# Patient Record
Sex: Male | Born: 1970 | Race: White | Hispanic: No | Marital: Single
Health system: Midwestern US, Academic
[De-identification: ages and names within clinical notes are randomized; demographics above are authoritative.]

---

## 2019-10-21 ENCOUNTER — Encounter: Admit: 2019-10-21 | Discharge: 2019-10-21 | Payer: BC Managed Care – PPO

## 2019-10-21 NOTE — Telephone Encounter
He presented to [facility] with leg pain.  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  Apparently his symptoms have worsened recently, but he has had some leg pain for a while.  He is able to walk approximately 50 feet prior to onset of the pain.  He has no pain at rest or nonhealing ulcers by report.  He had vein mapping, but I have not been able to review this.  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  I reviewed the angiogram.  He will likely need a right distal SFA to popliteal bypass.  He has good tibial runoff.  There is severe tortuosity of the aneurysmal proximal popliteal artery proximal to the occlusion.  they were unable to cross the area of occlusion.    We will plan to set up outpatient office visit or telehealth visit to discuss prior to surgery.    Valrie Hart, MD

## 2019-10-22 ENCOUNTER — Encounter: Admit: 2019-10-22 | Discharge: 2019-10-22 | Payer: BC Managed Care – PPO

## 2019-11-02 ENCOUNTER — Encounter: Admit: 2019-11-02 | Discharge: 2019-11-02 | Payer: BC Managed Care – PPO

## 2019-11-04 ENCOUNTER — Ambulatory Visit: Admit: 2019-11-04 | Discharge: 2019-11-05 | Payer: BC Managed Care – PPO

## 2019-11-04 ENCOUNTER — Encounter: Admit: 2019-11-04 | Discharge: 2019-11-04 | Payer: BC Managed Care – PPO

## 2019-11-04 ENCOUNTER — Inpatient Hospital Stay: Admit: 2019-11-04 | Discharge: 2019-11-04 | Payer: BC Managed Care – PPO

## 2019-11-04 DIAGNOSIS — I739 Peripheral vascular disease, unspecified: Secondary | ICD-10-CM

## 2019-11-04 DIAGNOSIS — Z20828 Contact with and (suspected) exposure to other viral communicable diseases: Secondary | ICD-10-CM

## 2019-11-04 DIAGNOSIS — I10 Essential (primary) hypertension: Secondary | ICD-10-CM

## 2019-11-04 DIAGNOSIS — I82409 Acute embolism and thrombosis of unspecified deep veins of unspecified lower extremity: Secondary | ICD-10-CM

## 2019-11-04 DIAGNOSIS — I724 Aneurysm of artery of lower extremity: Secondary | ICD-10-CM

## 2019-11-04 DIAGNOSIS — L97512 Non-pressure chronic ulcer of other part of right foot with fat layer exposed: Secondary | ICD-10-CM

## 2019-11-04 DIAGNOSIS — I70201 Unspecified atherosclerosis of native arteries of extremities, right leg: Secondary | ICD-10-CM

## 2019-11-04 DIAGNOSIS — E782 Mixed hyperlipidemia: Secondary | ICD-10-CM

## 2019-11-04 MED ORDER — CEFAZOLIN IVPB
3 g | Freq: Once | INTRAVENOUS | 0 refills | Status: CN
Start: 2019-11-04 — End: ?

## 2019-11-04 NOTE — Patient Instructions
Check in at [facility] of [facility] main campus on November 12, 2019.     [address]  [place], [place] [address]    You can park in the P3 parking garage, located directly across from the main entrance of the hospital ([address] [place], [place] [address]), go through the main entrance, and on the left hand side next to the gift shop is admissions; this is where you will check-in on the day of surgery. You can validate your ticket at the admissions desk. You will receive a call from the surgical team the day prior to your surgery after 2 pm, to inform  you of what time you will need to arrive in admitting the day of your surgery.  If you have not been contacted by 4:30 pm the day prior to your surgery date, please call [phone] or [phone].    Follow the Hibiclens bathing protocol as instructed the night before and the morning of surgery.    You will be nothing by mouth (NPO) after midnight, the day before your surgery.  Take your morning blood pressure and heart pills as prescribed with a sip of water. Do NOT stop aspirin or Plavix(Clopidogrel) if applicable.  IF you take insulin: Hold all regular insulins that morning and if you take lantus at bedtime normally, take 1/3rd of the dose instead of the full dose night before surgery. Hold oral blood sugar lowering medications as well in the morning.     **Hold Xarelto 2 days prior to surgery, last dose on 11/10      For any questions please don't hesitate to call Joni Reining RN at [phone] or [phone]    Lower Extremity Bypass  What to expect when I have my Surgical Procedure...    Lower extremity bypass grafting includes procedures such as Femoral (groin) to Popliteal (behind the knee), Femoral-Tibial (calf) or Femoral-Distal (closer to the foot) Bypass Grafting.  The operation usually takes between 2 and 3 hours if a vein from your own body is used for the graft.  Using a synthetic graft takes less time but is only used when a vein is not available or when vein is not the best choice for the bypass.  Usually you will be discharged from the hospital in 3 days with synthetic graft, or 5 days with vein graft.  Pain should be adequately controlled in the hospital with IV, epidural, and oral medication. You will be given a prescription for pain pills when you leave the hospital.  It is not uncommon to have some redness and swelling in the area where you had your surgery.  The redness should gradually fade, and the swelling should be kept to a minimum by resting in between activities with the affected leg(s) elevated above the level of your heart (in a recliner, or on a couch or bed).  You will have no real lifting restrictions, although you should not carry anything too heavy while your wounds are healing.  You should walk regularly and gradually increase activity, but know that until you are completely healed, increased activity will increase swelling.  The more you can elevate the affected leg(s) between activities, the less swelling you will have.   Elevation will make it less likely that you will suffer from possible wound complications such as seroma (the collection of lymph fluid that can cause good sized lumps under the skin near incision lines), fluid drainage, and infection.  Smoking also increases your risk of having  complications, as does having Diabetes.  Post-operative complications increase your chances for having graft thrombosis which is a partial or complete loss of blood flow through the graft.  The most common post-op complication is leg swelling with fluid drainage which can be straw colored like urine, pink, or even red-brown like old blood. Other potential complications include bleeding, infection, and graft thrombosis.      You should call the Doctor immediately:    ? if your surgical site becomes so swollen that it is difficult to walk, or   ? if the swelling increases suddenly (over a few minutes) ? if you have any symptoms of loss of circulation to your leg(s) such as loss of a graft pulse, cold or discolored foot, and increased pain.  1.   2. You should also notify the Doctor if you experience any signs or symptoms of infection like:   3.   ? redness that gets worse rather than better,   ? drainage from the wound that is like pus, and   ? fever greater than 100.    You should arrange a follow up appointment with the Doctor 10-14 days following surgery for staple/suture removal.  You may need to call the office to arrange an appointment time. [phone]) Once the incision line has healed completely, using a vitamin E ointment or a moisturizing cream may help reduce scarring.      You may notice some numbness along your incision line, or even some strange sensations with walking after surgery.  This is not uncommon and often will resolve within a few weeks after surgery.  Sometimes the numbness can last for months, and in some cases, may not go away.  This is due to the unavoidable disruption of the nerves along the incision lines.      Your activity restrictions are minimal.  If you drive, you may resume driving one week following surgery, if you are no longer taking pain pills and can comfortably move your foot to and from the gas pedal and brake.  After one week, you may gently begin to return to your normal range of motion.    You may shower 72 hours after surgery, and pat the incision lines dry.     You should expect to take one enteric-coated aspirin (EC aspirin) every day from now on unless specifically instructed to do otherwise. Taking one aspirin a day (or the prescribed alternative) will help the graft(s) stay open and reduce the chances that you will need additional vascular surgery in the future. Sometimes Coumadin or anti-platelet medications like Ticlid or Plavix are required, especially when a synthetic graft is used. Maintaining blood flow through your new graft(s) will be an on-going process.  You can help maintain your graft in several ways.  First, Don?t Smoke!  Smoking increases the risk of graft failure, both in the short and long term.  Next, take your medications as directed.  Anti-platelet agents or blood thinners help maintain graft patency.  If you are diabetic, maintaining good blood sugar control will reduce the progression of new arterial plaque (plaque contributes to graft thrombosis).  If your cholesterol or triglyceride levels are elevated, these should be corrected with dietary changes and, when necessary, medication.      If you have a graft pulse that can be felt with your fingers, you will need to check it daily.  A loss of the graft pulse should be reported to your Doctor immediately.    Additional graft  monitoring will be provided on a regular basis through the office with either Doppler (sound waves) or Duplex (sound waves with imaging) of the graft.  Initially this will be every 3 months.  That time interval will increase the longer your graft remains open.    Your help in following these recommendations will assist [place] in providing you with the best possible care for you and your new Bypass Graft.    Bathing Instructions Before Surgery  Before surgery, you can play an important role in your health. Skin is not sterile, but you can reduce the number of germs on your skin by carefully washing before surgery. Please follow these instructions:  IMPORTANT:  You will need to shower with a special soap called chlorhexidine gluconate (CHG). A common brand name for this soap is Hibiclens, but any brand is acceptable. The soap comes in a liquid form and can be purchased from most pharmacies, or you may request it from the hospital at which you are having surgery. You will only need a small bottle, about 4-6 ounces.  Showering Instructions:  ? You will take two CHG showers. The first shower should be taken the night before surgery and the second shower the morning of surgery.  ? With each shower, wash your hair and face as usual with your normal shampoo and soap. Rinse your hair and body thoroughly afterward to remove the residue.  ? Do not shave the area of your body where your surgery will be performed. Any new cut, abrasion or rash on your surgical extremity will need to be evaluated and may cause a delay in your procedure.  ? Turn water off before applying the CHG soap to prevent rinsing it off too soon. Apply the soap to your entire body from the jaw down, using a clean (freshly laundered) washcloth or your hands. Do not use CHG near your eyes, ears, nose or mouth. Scrub thoroughly for 5 minutes, paying special attention to the area where your surgery will be performed. Do not scrub your skin too hard. Do not wash with your regular soap after using the CHG. Turn the water back on and rinse your body well.  ? Pat yourself dry with a fresh, clean (freshly laundered) towel after each shower. Put on clean clothes or pajamas. Use freshly laundered bed linens.  ? Do not apply any lotions, perfumes, or powders after use.

## 2019-11-04 NOTE — Progress Notes
PAC appt scheduled w/pt for 11/05/19 @ [address]. Pt's surgery on 11/12/19 w/Dr. Delice Bison. Pt hx includes DVT, PAD, CAD, CABG, HTN, HLD, Morbidly obese and current smoker. Labs from 10/20/19, glucose 165. Pt on Xarelto.

## 2019-11-05 ENCOUNTER — Encounter: Admit: 2019-11-05 | Discharge: 2019-11-05 | Payer: BC Managed Care – PPO

## 2019-11-07 ENCOUNTER — Encounter: Admit: 2019-11-07 | Discharge: 2019-11-07 | Payer: BC Managed Care – PPO

## 2019-11-07 DIAGNOSIS — I739 Peripheral vascular disease, unspecified: Secondary | ICD-10-CM

## 2019-11-07 DIAGNOSIS — I10 Essential (primary) hypertension: Secondary | ICD-10-CM

## 2019-11-07 DIAGNOSIS — I82409 Acute embolism and thrombosis of unspecified deep veins of unspecified lower extremity: Secondary | ICD-10-CM

## 2019-11-09 ENCOUNTER — Ambulatory Visit: Admit: 2019-11-09 | Discharge: 2019-11-09 | Payer: BC Managed Care – PPO

## 2019-11-09 ENCOUNTER — Encounter: Admit: 2019-11-09 | Discharge: 2019-11-09 | Payer: BC Managed Care – PPO

## 2019-11-09 DIAGNOSIS — Z01818 Encounter for other preprocedural examination: Secondary | ICD-10-CM

## 2019-11-09 DIAGNOSIS — E785 Hyperlipidemia, unspecified: Secondary | ICD-10-CM

## 2019-11-09 DIAGNOSIS — I739 Peripheral vascular disease, unspecified: Secondary | ICD-10-CM

## 2019-11-09 DIAGNOSIS — I2510 Atherosclerotic heart disease of native coronary artery without angina pectoris: Secondary | ICD-10-CM

## 2019-11-09 DIAGNOSIS — I82409 Acute embolism and thrombosis of unspecified deep veins of unspecified lower extremity: Secondary | ICD-10-CM

## 2019-11-09 DIAGNOSIS — I10 Essential (primary) hypertension: Secondary | ICD-10-CM

## 2019-11-09 DIAGNOSIS — G4733 Obstructive sleep apnea (adult) (pediatric): Secondary | ICD-10-CM

## 2019-11-09 NOTE — Telephone Encounter
COVID swab order faxed to [facility], they will contact the patient to schedule.

## 2019-11-09 NOTE — Progress Notes
[facility] Pharmacist Medication Plan Note:    Nicholas Rodgers was seen in the [facility] on 11/09/19.  As part of the visit, an accurate medication list was obtained and the patient was given pre-op medication instructions for upcoming surgery on 11/12/19 with Dr. Delice Bison.      Per Dolores Frame RN on 11/04/19, "Hold Xarelto 2 days prior to surgery, last dose on 11/10."    Per Dorena Cookey med assist with Dr. Laurier Nancy, okay to hold Xarelto for 2 days prior to the procedure.    The plan above was communicated to the patient who verbalized understanding during [facility] office visit.     David Stall, [place]

## 2019-11-10 ENCOUNTER — Encounter: Admit: 2019-11-10 | Discharge: 2019-11-10 | Payer: BC Managed Care – PPO

## 2019-11-10 DIAGNOSIS — Z20828 Contact with and (suspected) exposure to other viral communicable diseases: Secondary | ICD-10-CM

## 2019-11-10 LAB — COVID-19 (SARS-COV-2) PCR: Lab: NEGATIVE

## 2019-11-12 ENCOUNTER — Encounter: Admit: 2019-11-12 | Discharge: 2019-11-12 | Payer: BC Managed Care – PPO

## 2019-11-12 ENCOUNTER — Inpatient Hospital Stay: Admit: 2019-11-12 | Discharge: 2019-11-12 | Payer: BC Managed Care – PPO

## 2019-11-12 MED ORDER — ACETAMINOPHEN 325 MG PO TAB
650 mg | ORAL | 0 refills | Status: DC
Start: 2019-11-12 — End: 2019-11-16
  Administered 2019-11-12 – 2019-11-16 (×15): 650 mg via ORAL

## 2019-11-12 MED ORDER — MAGNESIUM SULFATE IN D5W 1 GRAM/100 ML IV PGBK
1 g | INTRAVENOUS | 0 refills | Status: DC | PRN
Start: 2019-11-12 — End: 2019-11-16

## 2019-11-12 MED ORDER — SODIUM CHLORIDE 0.9 % IV SOLP
1000 mL | INTRAVENOUS | 0 refills | Status: AC
Start: 2019-11-12 — End: ?
  Administered 2019-11-12: 14:00:00 1000 mL via INTRAVENOUS

## 2019-11-12 MED ORDER — RIVAROXABAN 20 MG PO TAB
20 mg | Freq: Every day | ORAL | 0 refills | Status: DC
Start: 2019-11-12 — End: 2019-11-16
  Administered 2019-11-13 – 2019-11-16 (×4): 20 mg via ORAL

## 2019-11-12 MED ORDER — ELECTROLYTE-A IV SOLP
0 refills | Status: DC
Start: 2019-11-12 — End: 2019-11-12
  Administered 2019-11-12: 16:00:00 via INTRAVENOUS

## 2019-11-12 MED ORDER — ONDANSETRON HCL (PF) 4 MG/2 ML IJ SOLN
4 mg | INTRAVENOUS | 0 refills | Status: DC | PRN
Start: 2019-11-12 — End: 2019-11-13

## 2019-11-12 MED ORDER — OXYCODONE 5 MG PO TAB
5-10 mg | Freq: Once | ORAL | 0 refills | Status: CP | PRN
Start: 2019-11-12 — End: ?
  Administered 2019-11-12: 20:00:00 10 mg via ORAL

## 2019-11-12 MED ORDER — ONDANSETRON HCL (PF) 4 MG/2 ML IJ SOLN
INTRAVENOUS | 0 refills | Status: DC
Start: 2019-11-12 — End: 2019-11-12
  Administered 2019-11-12: 19:00:00 4 mg via INTRAVENOUS

## 2019-11-12 MED ORDER — POTASSIUM, SODIUM PHOSPHATES 280-160-250 MG PO PWPK
1 | NASOGASTRIC | 0 refills | Status: DC | PRN
Start: 2019-11-12 — End: 2019-11-16

## 2019-11-12 MED ORDER — LIDOCAINE (PF) 200 MG/10 ML (2 %) IJ SYRG
0 refills | Status: DC
Start: 2019-11-12 — End: 2019-11-12
  Administered 2019-11-12: 16:00:00 100 mg via INTRAVENOUS

## 2019-11-12 MED ORDER — FENTANYL CITRATE (PF) 50 MCG/ML IJ SOLN
12.5-25 ug | INTRAVENOUS | 0 refills | Status: DC | PRN
Start: 2019-11-12 — End: 2019-11-12

## 2019-11-12 MED ORDER — ROCURONIUM 10 MG/ML IV SOLN
INTRAVENOUS | 0 refills | Status: DC
Start: 2019-11-12 — End: 2019-11-12
  Administered 2019-11-12: 18:00:00 20 mg via INTRAVENOUS
  Administered 2019-11-12: 16:00:00 50 mg via INTRAVENOUS
  Administered 2019-11-12 (×3): 20 mg via INTRAVENOUS

## 2019-11-12 MED ORDER — KETAMINE 10 MG/ML IJ SOLN
0 refills | Status: DC
Start: 2019-11-12 — End: 2019-11-12
  Administered 2019-11-12: 18:00:00 30 mg via INTRAVENOUS

## 2019-11-12 MED ORDER — PROPOFOL INJ 10 MG/ML IV VIAL
0 refills | Status: DC
Start: 2019-11-12 — End: 2019-11-12
  Administered 2019-11-12: 16:00:00 200 mg via INTRAVENOUS

## 2019-11-12 MED ORDER — MIDAZOLAM 1 MG/ML IJ SOLN
INTRAVENOUS | 0 refills | Status: DC
Start: 2019-11-12 — End: 2019-11-12
  Administered 2019-11-12: 16:00:00 2 mg via INTRAVENOUS

## 2019-11-12 MED ORDER — MORPHINE 2 MG/ML IV SYRG
2 mg | INTRAVENOUS | 0 refills | Status: DC | PRN
Start: 2019-11-12 — End: 2019-11-13

## 2019-11-12 MED ORDER — PROTAMINE 10 MG/ML IV SOLN
0 refills | Status: DC
Start: 2019-11-12 — End: 2019-11-12
  Administered 2019-11-12: 19:00:00 50 mg via INTRAVENOUS

## 2019-11-12 MED ORDER — POTASSIUM CHLORIDE 20 MEQ PO TBTQ
40-60 meq | ORAL | 0 refills | Status: DC | PRN
Start: 2019-11-12 — End: 2019-11-16

## 2019-11-12 MED ORDER — SENNOSIDES-DOCUSATE SODIUM 8.6-50 MG PO TAB
2 | Freq: Two times a day (BID) | ORAL | 0 refills | Status: DC | PRN
Start: 2019-11-12 — End: 2019-11-15

## 2019-11-12 MED ORDER — ONDANSETRON HCL (PF) 4 MG/2 ML IJ SOLN
4 mg | Freq: Once | INTRAVENOUS | 0 refills | Status: DC | PRN
Start: 2019-11-12 — End: 2019-11-12

## 2019-11-12 MED ORDER — OXYCODONE 5 MG PO TAB
5-10 mg | ORAL | 0 refills | Status: DC | PRN
Start: 2019-11-12 — End: 2019-11-14
  Administered 2019-11-12 – 2019-11-14 (×9): 10 mg via ORAL

## 2019-11-12 MED ORDER — HEPARIN 5,000 UNITS IN LR 500 ML IRR
0 refills | Status: DC
Start: 2019-11-12 — End: 2019-11-12
  Administered 2019-11-12 (×2): 500 mL

## 2019-11-12 MED ORDER — LISINOPRIL-HYDROCHLOROTHIAZIDE 20-12.5 MG TAB
Freq: Every day | ORAL | 0 refills | Status: DC
Start: 2019-11-12 — End: 2019-11-14
  Administered 2019-11-12 – 2019-11-14 (×6): via ORAL

## 2019-11-12 MED ORDER — CEFAZOLIN 1GM NS IRR
0 refills | Status: DC
Start: 2019-11-12 — End: 2019-11-12
  Administered 2019-11-12 (×2): 1000 mL

## 2019-11-12 MED ORDER — SODIUM PHOSPHATE IVPB
8 MMOL | INTRAVENOUS | 0 refills | Status: DC | PRN
Start: 2019-11-12 — End: 2019-11-16

## 2019-11-12 MED ORDER — LIDOCAINE (PF) 10 MG/ML (1 %) IJ SOLN
.1-2 mL | INTRAMUSCULAR | 0 refills | Status: DC | PRN
Start: 2019-11-12 — End: 2019-11-16

## 2019-11-12 MED ORDER — PHENYLEPHRINE 40 MCG/ML IN NS IV DRIP (STD CONC)
0 refills | Status: DC
Start: 2019-11-12 — End: 2019-11-12
  Administered 2019-11-12 (×2): 0.2 ug/kg/min via INTRAVENOUS

## 2019-11-12 MED ORDER — POTASSIUM CHLORIDE IN WATER 10 MEQ/50 ML IV PGBK
10 meq | INTRAVENOUS | 0 refills | Status: DC | PRN
Start: 2019-11-12 — End: 2019-11-16

## 2019-11-12 MED ORDER — FENTANYL CITRATE (PF) 50 MCG/ML IJ SOLN
25-50 ug | INTRAVENOUS | 0 refills | Status: DC | PRN
Start: 2019-11-12 — End: 2019-11-12
  Administered 2019-11-12: 20:00:00 50 ug via INTRAVENOUS

## 2019-11-12 MED ORDER — DEXTRAN 70-HYPROMELLOSE (PF) 0.1-0.3 % OP DPET
0 refills | Status: DC
Start: 2019-11-12 — End: 2019-11-12
  Administered 2019-11-12: 16:00:00 2 [drp] via OPHTHALMIC

## 2019-11-12 MED ORDER — SENNOSIDES-DOCUSATE SODIUM 8.6-50 MG PO TAB
2 | Freq: Every day | ORAL | 0 refills | Status: CN | PRN
Start: 2019-11-12 — End: ?

## 2019-11-12 MED ORDER — ATORVASTATIN 40 MG PO TAB
40 mg | Freq: Every day | ORAL | 0 refills | Status: DC
Start: 2019-11-12 — End: 2019-11-16
  Administered 2019-11-13 – 2019-11-16 (×4): 40 mg via ORAL

## 2019-11-12 MED ORDER — HEPARIN (PORCINE) 1,000 UNIT/ML IJ SOLN
0 refills | Status: DC
Start: 2019-11-12 — End: 2019-11-12
  Administered 2019-11-12: 18:00:00 15000 [IU] via INTRAVENOUS

## 2019-11-12 MED ORDER — CEFAZOLIN INJ 1GM IVP
3 g | Freq: Once | INTRAVENOUS | 0 refills | Status: CP
Start: 2019-11-12 — End: ?
  Administered 2019-11-12: 16:00:00 3 g via INTRAVENOUS

## 2019-11-12 MED ORDER — MAGNESIUM CHLORIDE 64 MG PO TBER
535 mg | ORAL | 0 refills | Status: DC | PRN
Start: 2019-11-12 — End: 2019-11-16
  Administered 2019-11-13 – 2019-11-14 (×2): 535 mg via ORAL

## 2019-11-12 MED ORDER — SUGAMMADEX 100 MG/ML IV SOLN
INTRAVENOUS | 0 refills | Status: DC
Start: 2019-11-12 — End: 2019-11-12
  Administered 2019-11-12: 19:00:00 357 mg via INTRAVENOUS

## 2019-11-12 MED ORDER — HEPARIN 5,000 UNITS IN LR 500 ML IRR
Freq: Once | 0 refills | Status: AC
Start: 2019-11-12 — End: ?

## 2019-11-12 MED ORDER — POTASSIUM CHLORIDE 20 MEQ/15 ML PO LIQD
40-60 meq | NASOGASTRIC | 0 refills | Status: DC | PRN
Start: 2019-11-12 — End: 2019-11-16

## 2019-11-12 MED ORDER — FENTANYL CITRATE (PF) 50 MCG/ML IJ SOLN
0 refills | Status: DC
Start: 2019-11-12 — End: 2019-11-12
  Administered 2019-11-12 (×2): 50 ug via INTRAVENOUS
  Administered 2019-11-12: 16:00:00 150 ug via INTRAVENOUS

## 2019-11-12 MED ORDER — PHENYLEPHRINE IN 0.9% NACL(PF) 1 MG/10 ML (100 MCG/ML) IV SYRG
INTRAVENOUS | 0 refills | Status: DC
Start: 2019-11-12 — End: 2019-11-12
  Administered 2019-11-12 (×3): 100 ug via INTRAVENOUS

## 2019-11-12 MED ORDER — LABETALOL 5 MG/ML IV SYRG
5 mg | 0 refills | Status: DC | PRN
Start: 2019-11-12 — End: 2019-11-16

## 2019-11-12 MED ORDER — PROMETHAZINE 25 MG/ML IJ SOLN
6.25 mg | INTRAVENOUS | 0 refills | Status: DC | PRN
Start: 2019-11-12 — End: 2019-11-12

## 2019-11-12 MED ORDER — POTASSIUM PHOSPHATE, MONOBASIC 500 MG PO TBSO
2 | ORAL | 0 refills | Status: DC | PRN
Start: 2019-11-12 — End: 2019-11-16

## 2019-11-12 MED ORDER — SUCCINYLCHOLINE CHLORIDE 20 MG/ML IJ SOLN
INTRAVENOUS | 0 refills | Status: DC
Start: 2019-11-12 — End: 2019-11-12
  Administered 2019-11-12: 16:00:00 50 mg via INTRAVENOUS
  Administered 2019-11-12: 16:00:00 200 mg via INTRAVENOUS

## 2019-11-12 MED ORDER — ASPIRIN 81 MG PO CHEW
81 mg | Freq: Every day | ORAL | 0 refills | Status: DC
Start: 2019-11-12 — End: 2019-11-16
  Administered 2019-11-13 – 2019-11-16 (×4): 81 mg via ORAL

## 2019-11-12 MED ORDER — HYDROMORPHONE (PF) 2 MG/ML IJ SYRG
0 refills | Status: DC
Start: 2019-11-12 — End: 2019-11-12
  Administered 2019-11-12: 17:00:00 .5 mg via INTRAVENOUS
  Administered 2019-11-12: 18:00:00 0.5 mg via INTRAVENOUS
  Administered 2019-11-12 (×2): .5 mg via INTRAVENOUS

## 2019-11-12 MED ORDER — DEXAMETHASONE SODIUM PHOSPHATE 4 MG/ML IJ SOLN
INTRAVENOUS | 0 refills | Status: DC
Start: 2019-11-12 — End: 2019-11-12
  Administered 2019-11-12: 16:00:00 4 mg via INTRAVENOUS

## 2019-11-12 MED ORDER — FENTANYL CITRATE (PF) 50 MCG/ML IJ SOLN
50-100 ug | INTRAVENOUS | 0 refills | Status: DC | PRN
Start: 2019-11-12 — End: 2019-11-12
  Administered 2019-11-12: 20:00:00 50 ug via INTRAVENOUS

## 2019-11-12 MED ORDER — LABETALOL 5 MG/ML IV SYRG
0 refills | Status: DC
Start: 2019-11-12 — End: 2019-11-12
  Administered 2019-11-12 (×2): 5 mg via INTRAVENOUS

## 2019-11-12 MED ORDER — HALOPERIDOL LACTATE 5 MG/ML IJ SOLN
1 mg | Freq: Once | INTRAVENOUS | 0 refills | Status: DC | PRN
Start: 2019-11-12 — End: 2019-11-12

## 2019-11-13 MED ORDER — MUPIROCIN 2 % TP OINT
Freq: Two times a day (BID) | TOPICAL | 0 refills | Status: DC
Start: 2019-11-13 — End: 2019-11-16
  Administered 2019-11-13: 16:00:00 via TOPICAL

## 2019-11-13 MED ORDER — ONDANSETRON HCL (PF) 4 MG/2 ML IJ SOLN
4-8 mg | INTRAVENOUS | 0 refills | Status: DC | PRN
Start: 2019-11-13 — End: 2019-11-16

## 2019-11-14 ENCOUNTER — Encounter: Admit: 2019-11-14 | Discharge: 2019-11-14 | Payer: BC Managed Care – PPO

## 2019-11-14 DIAGNOSIS — I2510 Atherosclerotic heart disease of native coronary artery without angina pectoris: Secondary | ICD-10-CM

## 2019-11-14 DIAGNOSIS — E785 Hyperlipidemia, unspecified: Secondary | ICD-10-CM

## 2019-11-14 DIAGNOSIS — I739 Peripheral vascular disease, unspecified: Secondary | ICD-10-CM

## 2019-11-14 DIAGNOSIS — I82409 Acute embolism and thrombosis of unspecified deep veins of unspecified lower extremity: Secondary | ICD-10-CM

## 2019-11-14 DIAGNOSIS — I10 Essential (primary) hypertension: Secondary | ICD-10-CM

## 2019-11-14 DIAGNOSIS — G4733 Obstructive sleep apnea (adult) (pediatric): Secondary | ICD-10-CM

## 2019-11-14 MED ORDER — OXYCODONE 5 MG PO TAB
5-15 mg | ORAL | 0 refills | Status: DC | PRN
Start: 2019-11-14 — End: 2019-11-16
  Administered 2019-11-14 (×5): 5 mg via ORAL
  Administered 2019-11-14: 22:00:00 10 mg via ORAL
  Administered 2019-11-15 – 2019-11-16 (×10): 15 mg via ORAL

## 2019-11-14 MED ORDER — LISINOPRIL 20 MG PO TAB
20 mg | Freq: Every day | ORAL | 0 refills | Status: DC
Start: 2019-11-14 — End: 2019-11-16
  Administered 2019-11-15 – 2019-11-16 (×2): 20 mg via ORAL

## 2019-11-15 ENCOUNTER — Encounter: Admit: 2019-11-15 | Discharge: 2019-11-15 | Payer: BC Managed Care – PPO

## 2019-11-15 DIAGNOSIS — I10 Essential (primary) hypertension: Secondary | ICD-10-CM

## 2019-11-15 DIAGNOSIS — I2510 Atherosclerotic heart disease of native coronary artery without angina pectoris: Secondary | ICD-10-CM

## 2019-11-15 DIAGNOSIS — I739 Peripheral vascular disease, unspecified: Secondary | ICD-10-CM

## 2019-11-15 DIAGNOSIS — I82409 Acute embolism and thrombosis of unspecified deep veins of unspecified lower extremity: Secondary | ICD-10-CM

## 2019-11-15 DIAGNOSIS — G4733 Obstructive sleep apnea (adult) (pediatric): Secondary | ICD-10-CM

## 2019-11-15 DIAGNOSIS — E785 Hyperlipidemia, unspecified: Secondary | ICD-10-CM

## 2019-11-15 MED ORDER — POLYETHYLENE GLYCOL 3350 17 GRAM PO PWPK
1 | Freq: Every day | ORAL | 0 refills | Status: DC
Start: 2019-11-15 — End: 2019-11-16

## 2019-11-15 MED ORDER — BISACODYL 10 MG RE SUPP
10 mg | Freq: Every day | RECTAL | 0 refills | Status: DC | PRN
Start: 2019-11-15 — End: 2019-11-16

## 2019-11-15 MED ORDER — SENNOSIDES-DOCUSATE SODIUM 8.6-50 MG PO TAB
2 | Freq: Two times a day (BID) | ORAL | 0 refills | Status: DC
Start: 2019-11-15 — End: 2019-11-16
  Administered 2019-11-16 (×2): 2 via ORAL

## 2019-11-16 ENCOUNTER — Encounter: Admit: 2019-11-16 | Discharge: 2019-11-16 | Payer: BC Managed Care – PPO

## 2019-11-16 MED ORDER — ACETAMINOPHEN 325 MG PO TAB
650 mg | ORAL | 0 refills | Status: AC | PRN
Start: 2019-11-16 — End: ?

## 2019-11-16 MED ORDER — POLYETHYLENE GLYCOL 3350 17 GRAM PO PWPK
17 g | Freq: Every day | ORAL | 0 refills | 18.00000 days | Status: DC | PRN
Start: 2019-11-16 — End: 2020-01-02

## 2019-11-16 MED ORDER — POLYETHYLENE GLYCOL 3350 17 GRAM PO PWPK
17 g | Freq: Every day | ORAL | 0 refills | Status: CN | PRN
Start: 2019-11-16 — End: ?

## 2019-11-16 MED ORDER — NALOXONE 4 MG/ACTUATION NA SPRY
4 mg | Freq: Once | NASAL | 1 refills | 1.00000 days | Status: AC
Start: 2019-11-16 — End: ?

## 2019-11-16 MED ORDER — OXYCODONE 5 MG PO TAB
5-10 mg | ORAL_TABLET | ORAL | 0 refills | 6.00000 days | Status: DC | PRN
Start: 2019-11-16 — End: 2019-11-23

## 2019-11-16 MED ORDER — MUPIROCIN 2 % TP OINT
Freq: Two times a day (BID) | TOPICAL | 3 refills | 11.00000 days | Status: DC
Start: 2019-11-16 — End: 2020-01-02

## 2019-11-16 MED ORDER — SENNOSIDES-DOCUSATE SODIUM 8.6-50 MG PO TAB
2 | Freq: Every day | ORAL | 0 refills | Status: DC | PRN
Start: 2019-11-16 — End: 2020-01-02

## 2019-11-17 ENCOUNTER — Encounter: Admit: 2019-11-17 | Discharge: 2019-11-17 | Payer: BC Managed Care – PPO

## 2019-11-17 NOTE — Telephone Encounter
I received a call from [facility] / Amy RN. Authorization for an extended stay at [place] was approved for a total of 4 days beginning 11/12/19. Auth# [id number]. Call back # [phone] with fax # [phone].

## 2019-11-18 ENCOUNTER — Encounter: Admit: 2019-11-18 | Discharge: 2019-11-18 | Payer: BC Managed Care – PPO

## 2019-11-18 NOTE — Telephone Encounter
Add:  Notified Risk manager of daily dressing changes, she states another dilemma is the patient only has 30 home health visits a year with his insurance and she would like this to be placed into consideration, notified her that patient has office eval on 12.10.20 for re-eval but will forward to [facility] as fyi for follow up eval.

## 2019-11-18 NOTE — Telephone Encounter
Bella Kennedy, from [facility] in [place], is calling about the pt's discharge wound care orders. She started seeing him yesterday and his orders have dressing changes 2x daily. He is unable to do that himself, he is unable to bend over and reach his foot, there's no one to help him, and the facility is unable to do the 2x daily. Wondering if orders can be changed to once daily or once every other day. Please return call [phone]

## 2019-11-18 NOTE — Telephone Encounter
Reviewed with [facility] RN, ok to decrease to once daily dressing changes, not every other day.

## 2019-11-22 ENCOUNTER — Encounter: Admit: 2019-11-22 | Discharge: 2019-11-22 | Payer: BC Managed Care – PPO

## 2019-11-22 NOTE — Telephone Encounter
Who is calling? Nicholas Rodgers    What is the patients need/concern? PT STATES THAT HE IS OUT OF HIS OXYCODONE.    Which hospital was the patient seen at? Other N/A    Are there any other needs/concerns the patient has?  no      If yes, description of concern:    Patient was notified that if call was received after 3:30 pm the call may not be returned until the next business day. YES    Read back to patient before sending: yes    Call Back phone number: [phone]

## 2019-11-23 MED ORDER — OXYCODONE 5 MG PO TAB
5-10 mg | ORAL_TABLET | ORAL | 0 refills | 6.00000 days | Status: DC | PRN
Start: 2019-11-23 — End: 2019-11-29

## 2019-11-29 MED ORDER — ATORVASTATIN 40 MG PO TAB
40 mg | ORAL_TABLET | Freq: Every day | ORAL | 3 refills | Status: AC
Start: 2019-11-29 — End: ?

## 2019-11-29 MED ORDER — OXYCODONE 5 MG PO TAB
5-10 mg | ORAL_TABLET | ORAL | 0 refills | 6.00000 days | Status: DC | PRN
Start: 2019-11-29 — End: 2019-12-06

## 2019-12-06 ENCOUNTER — Encounter: Admit: 2019-12-06 | Discharge: 2019-12-06 | Payer: BC Managed Care – PPO

## 2019-12-06 MED ORDER — OXYCODONE 5 MG PO TAB
5-10 mg | ORAL_TABLET | ORAL | 0 refills | 6.00000 days | Status: DC | PRN
Start: 2019-12-06 — End: 2020-01-11

## 2019-12-06 NOTE — Telephone Encounter
The patient called asking for a refill of his pain medication. Will further discuss with our nurse practitioner.

## 2019-12-06 NOTE — Telephone Encounter
I have refilled his prescription but he needs to be weaning off the narcotics.  I  refilled his prescription just a week ago too.Marland Kitchen  Neale Burly, APRN-NP

## 2019-12-09 ENCOUNTER — Encounter: Admit: 2019-12-09 | Discharge: 2019-12-09 | Payer: BC Managed Care – PPO

## 2019-12-09 ENCOUNTER — Ambulatory Visit: Admit: 2019-12-09 | Discharge: 2019-12-10 | Payer: BC Managed Care – PPO

## 2019-12-09 DIAGNOSIS — I82409 Acute embolism and thrombosis of unspecified deep veins of unspecified lower extremity: Secondary | ICD-10-CM

## 2019-12-09 DIAGNOSIS — E785 Hyperlipidemia, unspecified: Secondary | ICD-10-CM

## 2019-12-09 DIAGNOSIS — I10 Essential (primary) hypertension: Secondary | ICD-10-CM

## 2019-12-09 DIAGNOSIS — I739 Peripheral vascular disease, unspecified: Secondary | ICD-10-CM

## 2019-12-09 DIAGNOSIS — Z720 Tobacco use: Secondary | ICD-10-CM

## 2019-12-09 DIAGNOSIS — Z6841 Body Mass Index (BMI) 40.0 and over, adult: Secondary | ICD-10-CM

## 2019-12-09 DIAGNOSIS — Z95828 Presence of other vascular implants and grafts: Secondary | ICD-10-CM

## 2019-12-09 DIAGNOSIS — I2510 Atherosclerotic heart disease of native coronary artery without angina pectoris: Secondary | ICD-10-CM

## 2019-12-09 DIAGNOSIS — G4733 Obstructive sleep apnea (adult) (pediatric): Secondary | ICD-10-CM

## 2019-12-09 DIAGNOSIS — I70201 Unspecified atherosclerosis of native arteries of extremities, right leg: Secondary | ICD-10-CM

## 2019-12-09 MED ORDER — CEPHALEXIN 500 MG PO CAP
500 mg | ORAL_CAPSULE | Freq: Four times a day (QID) | ORAL | 0 refills | Status: AC
Start: 2019-12-09 — End: ?

## 2019-12-09 NOTE — Progress Notes
Date of Service: 12/09/2019              Chief Complaint   Patient presents with   ? Post Operative Visit     Right fem-pop bypass       History of Present Illness    This is a 48 year old gentleman who is status post Right superficial femoral artery to distal popliteal artery bypass with non-reversed great saphenous vein graft on November 13 of 2020 by Dr. Hollie Beach here at [facility].  He has a history of hypertension, hyperlipidemia, morbid obesity, deep vein thrombosis and coronary artery disease.  He denies current chest pain and shortness of breath.  He denies fever and chills.  He denies significant leg pain.  He says his right foot pain has resolved.  He complains of some small amount of serous drainage at the inferior aspect of his most superior incision on his thigh.  He would like to return to work in about a week.    He does admit to continued smoking.  He is on Xarelto and Lipitor daily.           Medical History:   Diagnosis Date   ? Coronary artery disease    ? Deep vein thrombosis (HCC)     bilateral lower extrmities   ? Hyperlipidemia    ? Hypertension    ? OSA on CPAP    ? Peripheral artery disease [facility])        Surgical History:   Procedure Laterality Date   ? BYPASS GRAFT FEMORAL-POPLITEAL ARTERY Right 11/12/2019    Performed by Lonell Grandchild, MD at [facility] OR   ? HX CORONARY ARTERY BYPASS GRAFT  ~2004    triple bypass       Allergies:  No Known Allergies    Medication List:  ? acetaminophen (TYLENOL) 325 mg tablet Take two tablets by mouth every 4 hours as needed for Pain. TOTAL ACETAMINOPHEN DOSE NOT TO EXCEED 4GM DAILY  Indications: pain   ? atorvastatin (LIPITOR) 40 mg tablet Take one tablet by mouth daily.   ? cephalexin (KEFLEX) 500 mg capsule Take one capsule by mouth four times daily for 7 days.   ? lisinopriL-hydrochlorothiazide (ZESTORETIC) 20-12.5 mg tablet Take 1 tablet by mouth daily. ? mupirocin (BACTROBAN) 2 % topical ointment Apply  topically to affected area twice daily. Apply to wounds on right plantar foot and right medial knee with twice daily dressing changes.   ? oxyCODONE (ROXICODONE) 5 mg tablet Take one tablet to two tablets by mouth every 4 hours as needed for Pain Indications: pain, s/p Right Femoral - Popliteal Artery Bypass Graft 11/12/2019   ? polyethylene glycol 3350 (MIRALAX) 17 g packet Take one packet by mouth daily as needed. Indications: constipation   ? senna/docusate (SENOKOT-S) 8.6/50 mg tablet Take two tablets by mouth daily as needed. Indications: constipation   ? vitamins, multiple tablet Take 1 tablet by mouth.   ? XARELTO 20 mg tablet Take 20 mg by mouth daily with breakfast.       Social History:   reports that he has been smoking cigarettes. He has a 17.50 pack-year smoking history. He has never used smokeless tobacco. He reports current alcohol use. He reports that he does not use drugs.    Family History   Problem Relation Age of Onset   ? Heart Disease Father    ? Cancer Father    ? Cancer Maternal Grandfather        Review of  Systems   Constitutional: Negative for activity change, appetite change, chills, diaphoresis, fatigue, fever and unexpected weight change.   HENT: Negative for hearing loss, nosebleeds and tinnitus.    Eyes: Negative for photophobia, itching and visual disturbance.   Respiratory: Positive for apnea. Negative for cough, chest tightness and shortness of breath.    Cardiovascular: Negative for chest pain, palpitations and leg swelling.   Gastrointestinal: Negative for abdominal pain, blood in stool, constipation, diarrhea, nausea and vomiting.   Endocrine: Negative for cold intolerance and heat intolerance.   Genitourinary: Negative for dysuria, frequency and hematuria.   Musculoskeletal: Negative for arthralgias, back pain, gait problem, joint swelling and myalgias.   Skin: Positive for wound. Negative for color change and rash. Allergic/Immunologic: Negative for environmental allergies, food allergies and immunocompromised state.   Neurological: Negative for dizziness, tremors, seizures, syncope, facial asymmetry, speech difficulty, weakness, light-headedness and headaches.   Hematological: Negative for adenopathy. Does not bruise/bleed easily.   Psychiatric/Behavioral: Negative for behavioral problems, confusion and dysphoric mood. The patient is not nervous/anxious.    All other systems reviewed and are negative.              Vitals:    12/09/19 0935 12/09/19 0937   BP: 103/61 124/63   BP Source: Arm, Right Upper Arm, Left Upper   Patient Position: Sitting Sitting   Pulse: 96 94   Temp:  36.8 ?C (98.2 ?F)   TempSrc:  Oral   Weight:  (!) 179.6 kg (396 lb)   Height:  182.9 cm (72)     Body mass index is 53.71 kg/m?Marland Kitchen     Physical Exam  Vitals signs and nursing note reviewed.   Constitutional:       General: He is not in acute distress.     Appearance: He is well-developed and well-groomed. He is morbidly obese. He is not diaphoretic.   HENT:      Head: Normocephalic.   Eyes:      General:         Right eye: No discharge.         Left eye: No discharge.   Neck:      Musculoskeletal: Normal range of motion.      Trachea: Phonation normal.   Cardiovascular:      Pulses:           Femoral pulses are 2+ on the right side and 2+ on the left side.     Comments: He has an easily palpable right medial leg bypass graft pulse.  Pulmonary:      Effort: Pulmonary effort is normal. No respiratory distress.   Feet:      Right foot:      Skin integrity: Skin integrity normal.      Left foot:      Skin integrity: Skin integrity normal.   Skin:     General: Skin is warm and dry.      Capillary Refill: Capillary refill takes less than 2 seconds.      Coloration: Skin is not cyanotic or mottled. Comments: His right leg incisions have staples in place.  The incision lines appear well-healed.  There is a small amount of redness around the staples particularly in his upper thigh.  His staples are removed today, tincture of benzoin and Steri-Strips applied.   Neurological:      General: No focal deficit present.      Mental Status: He is alert and oriented to person, place, and time.  Motor: Motor function is intact.      Coordination: Coordination is intact.      Gait: Gait is intact.   Psychiatric:         Attention and Perception: Attention normal.         Mood and Affect: Mood normal.         Speech: Speech normal.         Behavior: Behavior normal. Behavior is cooperative.         Thought Content: Thought content normal.             Assessment and Plan:    1. Popliteal artery occlusion, right (HCC)     2. PVD (peripheral vascular disease) (HCC)  US DOPPLER ART LE RIGHT    [place] NONINVASIVE LOWER EXT PVR   3. History of arterial bypass of lower extremity  US DOPPLER ART LE RIGHT    [place] NONINVASIVE LOWER EXT PVR   4. BMI 50.0-59.9, adult (HCC)     5. Tobacco abuse       He is doing well status post Right superficial femoral artery to distal popliteal artery bypass with non-reversed great saphenous vein graft.  His right foot pain has resolved.  He is having no complaints of symptoms of claudication when he walks.  His staples were removed today.  I discussed with him the importance of regular exercise and smoking cessation.  Because of some incisional redness and complaints of serous drainage from his thigh incision, I have prescribed Keflex.      He has questions about his compression hose that he normally wears.  We would like him to wait until at least his next ultrasound before resuming right leg compression.    He should continue his Xarelto and atorvastatin daily.  He should definitely stop smoking.    Keflex 500 mg 4 times a day for 7 days. We will follow-up in the next few weeks for a right leg bypass graft ultrasound with ABIs, and an office visit at that time.  He has a history of left popliteal artery occlusion as well .    We gave him information on a walking program and information on peripheral arterial disease and tobacco abuse for his visit summary.    Kathaleen Maser, APRN-NP

## 2019-12-10 DIAGNOSIS — I10 Essential (primary) hypertension: Secondary | ICD-10-CM

## 2019-12-10 DIAGNOSIS — F17200 Nicotine dependence, unspecified, uncomplicated: Secondary | ICD-10-CM

## 2019-12-10 DIAGNOSIS — E782 Mixed hyperlipidemia: Secondary | ICD-10-CM

## 2019-12-10 DIAGNOSIS — L97512 Non-pressure chronic ulcer of other part of right foot with fat layer exposed: Secondary | ICD-10-CM

## 2019-12-10 DIAGNOSIS — I724 Aneurysm of artery of lower extremity: Secondary | ICD-10-CM

## 2020-01-01 ENCOUNTER — Encounter: Admit: 2020-01-01 | Discharge: 2020-01-01 | Payer: BC Managed Care – PPO

## 2020-01-01 ENCOUNTER — Inpatient Hospital Stay: Admit: 2020-01-01 | Payer: BC Managed Care – PPO

## 2020-01-01 DIAGNOSIS — E785 Hyperlipidemia, unspecified: Secondary | ICD-10-CM

## 2020-01-01 DIAGNOSIS — I739 Peripheral vascular disease, unspecified: Secondary | ICD-10-CM

## 2020-01-01 DIAGNOSIS — L0390 Cellulitis, unspecified: Secondary | ICD-10-CM

## 2020-01-01 DIAGNOSIS — G4733 Obstructive sleep apnea (adult) (pediatric): Secondary | ICD-10-CM

## 2020-01-01 DIAGNOSIS — I82409 Acute embolism and thrombosis of unspecified deep veins of unspecified lower extremity: Secondary | ICD-10-CM

## 2020-01-01 DIAGNOSIS — I10 Essential (primary) hypertension: Secondary | ICD-10-CM

## 2020-01-01 DIAGNOSIS — I2510 Atherosclerotic heart disease of native coronary artery without angina pectoris: Secondary | ICD-10-CM

## 2020-01-01 LAB — PHOSPHORUS: Lab: 3.1 mg/dL (ref 2.0–4.5)

## 2020-01-01 LAB — BASIC METABOLIC PANEL
Lab: 0.7 mg/dL (ref 0.4–1.24)
Lab: 139 MMOL/L — ABNORMAL LOW (ref 137–147)
Lab: 14 mg/dL (ref 7–25)
Lab: 152 mg/dL — ABNORMAL HIGH (ref 70–100)
Lab: 30 MMOL/L (ref 21–30)
Lab: 8.7 mg/dL (ref 8.5–10.6)
Lab: >60 mL/min (ref >60)
Lab: >60 mL/min (ref >60)

## 2020-01-01 LAB — VANCOMYCIN TIMED LEVEL: Lab: 13 ug/mL

## 2020-01-01 LAB — COVID-19 (SARS-COV-2) PCR

## 2020-01-01 LAB — MAGNESIUM: Lab: 2.1 mg/dL — ABNORMAL LOW (ref 1.6–2.6)

## 2020-01-01 LAB — CBC
Lab: 16 10*3/uL — ABNORMAL HIGH (ref 4.5–11.0)
Lab: 32 g/dL (ref 32.0–36.0)

## 2020-01-01 LAB — LACTIC ACID(LACTATE): Lab: 1.1 MMOL/L — ABNORMAL LOW (ref 0.5–2.0)

## 2020-01-01 MED ORDER — ENOXAPARIN 40 MG/0.4 ML SC SYRG
40 mg | Freq: Every day | SUBCUTANEOUS | 0 refills | Status: DC
Start: 2020-01-01 — End: 2020-01-01

## 2020-01-01 MED ORDER — PIPERACILLIN/TAZOBACTAM 4.5 G/100ML NS IVPB (MB+)
4.5 g | INTRAVENOUS | 0 refills | Status: DC
Start: 2020-01-01 — End: 2020-01-07
  Administered 2020-01-01 – 2020-01-07 (×46): 4.5 g via INTRAVENOUS

## 2020-01-01 MED ORDER — VANCOMYCIN 2,000 MG IVPB
2000 mg | Freq: Two times a day (BID) | INTRAVENOUS | 0 refills | Status: DC
Start: 2020-01-01 — End: 2020-01-02
  Administered 2020-01-01 (×2): 2000 mg via INTRAVENOUS

## 2020-01-01 MED ORDER — OXYCODONE 5 MG PO TAB
5 mg | ORAL | 0 refills | Status: DC | PRN
Start: 2020-01-01 — End: 2020-01-02
  Administered 2020-01-02 (×2): 5 mg via ORAL

## 2020-01-01 MED ORDER — ENOXAPARIN 300 MG/3 ML SC SOLN
180 mg | Freq: Two times a day (BID) | SUBCUTANEOUS | 0 refills | Status: DC
Start: 2020-01-01 — End: 2020-01-02

## 2020-01-01 MED ORDER — ACETAMINOPHEN 325 MG PO TAB
650 mg | ORAL | 0 refills | Status: DC
Start: 2020-01-01 — End: 2020-01-02
  Administered 2020-01-02 (×2): 650 mg via ORAL

## 2020-01-01 MED ORDER — VANCOMYCIN PHARMACY TO MANAGE
1 | 0 refills | Status: DC
Start: 2020-01-01 — End: 2020-01-07

## 2020-01-01 MED ORDER — FENTANYL CITRATE (PF) 50 MCG/ML IJ SOLN
25-50 ug | INTRAVENOUS | 0 refills | Status: DC | PRN
Start: 2020-01-01 — End: 2020-01-11
  Administered 2020-01-01 – 2020-01-03 (×13): 50 ug via INTRAVENOUS
  Administered 2020-01-03 (×3): 25 ug via INTRAVENOUS
  Administered 2020-01-04: 23:00:00 50 ug via INTRAVENOUS
  Administered 2020-01-04: 08:00:00 25 ug via INTRAVENOUS
  Administered 2020-01-04 (×2): 50 ug via INTRAVENOUS
  Administered 2020-01-04: 12:00:00 25 ug via INTRAVENOUS
  Administered 2020-01-04 – 2020-01-06 (×23): 50 ug via INTRAVENOUS
  Administered 2020-01-06: 20:00:00 25 ug via INTRAVENOUS
  Administered 2020-01-06: 13:00:00 50 ug via INTRAVENOUS
  Administered 2020-01-07 (×3): 25 ug via INTRAVENOUS
  Administered 2020-01-07 (×3): 50 ug via INTRAVENOUS
  Administered 2020-01-07: 15:00:00 25 ug via INTRAVENOUS
  Administered 2020-01-07 – 2020-01-08 (×12): 50 ug via INTRAVENOUS
  Administered 2020-01-10: 15:00:00 25 ug via INTRAVENOUS
  Administered 2020-01-10 (×2): 50 ug via INTRAVENOUS

## 2020-01-01 MED ORDER — LACTATED RINGERS IV SOLP
INTRAVENOUS | 0 refills | Status: AC
Start: 2020-01-01 — End: ?
  Administered 2020-01-01 – 2020-01-03 (×5): 1000.000 mL via INTRAVENOUS

## 2020-01-01 MED ORDER — ONDANSETRON HCL (PF) 4 MG/2 ML IJ SOLN
4-8 mg | INTRAVENOUS | 0 refills | Status: DC | PRN
Start: 2020-01-01 — End: 2020-01-11

## 2020-01-01 NOTE — Progress Notes
RT Adult Assessment Note    NAME:Khush VITTORIO MOHS             MRN: [id number]             DOB:1971-03-25          AGE: 49 y.o.  ADMISSION DATE: 01/01/2020             DAYS ADMITTED: LOS: 0 days    RT Treatment Plan:       Protocol Plan: Procedures  PAP: Place a nursing order for "IS Q1h While Awake" for any of Lung Expansion indicators  SpO2: Continuous (Document SpO2 result Qshift)  Comment: OSA    Additional Comments:  Impressions of the patient: Pt currently resting in bed on RA, non-labored at this time; will cont to monitor per OSA  Intervention(s)/outcome(s): RT eval   Patient education that was completed: n/a  Recommendations to the care team: n/a    Vital Signs:  Pulse: 99  RR: 20 PER MINUTE  SpO2: 96 %  O2 Device:    Liter Flow:    O2%: (RA)  Breath Sounds: Clear (Implies normal);Decreased  Respiratory Effort: Non-Labored

## 2020-01-01 NOTE — H&P (View-Only)
[facility] H&P  01/01/2020       Patient: Nicholas Rodgers  MRN: [id number]    Admission Date:  01/01/2020, LOS: 0 days  Admission Diagnosis: Cellulitis [L03.90]    Attending Surgeon: Carlena Sax, MD  H&P Performed by: Lucienne Capers , MD    ASSESSMENT: ROWELL FOSNIGHT is a 49 y.o. male with HTN, HLD, hx of VTE, OSA on CPAP and morbid obesity who underwent a right superficial femoral artery to distal popliteal artery bypass with a non-reverse great saphenous vein graft on 11/13 for peripheral vascular disease with ischemic rest pain to the right foot and nonhealing ulcer who presents with RLE cellulitis.    PLAN:  - no acute surgical intervention  - vanc/zosyn for cellulitis, no drainable fluid collection on exam  - doppler signals throughout the RLE  - hold PTA Xarelto, start therapeutic lovenox for prior DVTs; low suspicion for recurrent DVT, suspect swelling of the RLE is secondary to inflammation from his cellulitis  - regular diet  - pain control with PO pain  - PT/OT consult    Discussed with Dr. Phil Dopp.     C. , MD  Service Pager: [phone]   _____________________________________________________________________________ HPI: Nicholas Rodgers is a 49 y.o. male with HTN, HLD, hx of VTE, OSA on CPAP and morbid obesity who underwent a right superficial femoral artery to distal popliteal artery bypass with a non-reverse great saphenous vein graft on 11/13 for peripheral vascular disease with ischemic rest pain to the right foot and nonhealing ulcer who presents with cellulitis of the right lower extremity. At his follow up appointment, he was noted to have serous drainage from the superior aspect of his groin wound with some mild erythema and was prescribed a 7 day course of Keflex which he completed on 12/27. On 12/30 he had ongoing drainage and pain after work, so he presented to his local emergency department and was prescribed a course of doxycycline which he has been taking. This morning, he woke up and his leg was swollen and the lower portion significant erythematous and painful, so he again presented to his local emergency department. He was given a dose of vancomycin and transferred to [facility] for further evaluation. He has not had any rest pain and he has good sensation and motor ability in his RLE. He did have some subjective fevers since 12/30, but infrequently. He has been taking his Xarelto.    Medical History:   Diagnosis Date   ? Coronary artery disease    ? Deep vein thrombosis (HCC)     bilateral lower extrmities   ? Hyperlipidemia    ? Hypertension    ? OSA on CPAP    ? Peripheral artery disease [facility])      Surgical History:   Procedure Laterality Date   ? BYPASS GRAFT FEMORAL-POPLITEAL ARTERY Right 11/12/2019    Performed by Lonell Grandchild, MD at [facility]   ? HX CORONARY ARTERY BYPASS GRAFT  ~2004    triple bypass     Family History   Problem Relation Age of Onset   ? Heart Disease Father    ? Cancer Father    ? Cancer Maternal Grandfather      Social History     Tobacco Use   ? Smoking status: Current Every Day Smoker     Packs/day: 0.50     Years: 35.00     Pack years: 17.50     Types: Cigarettes ? Smokeless tobacco:  Never Used   Substance Use Topics   ? Alcohol use: Yes     Comment: maybe 2-3 drinks per month     Your Current Medications:       Instructions    acetaminophen (TYLENOL) 325 mg tablet Take two tablets by mouth every 4 hours as needed for Pain. TOTAL ACETAMINOPHEN DOSE NOT TO EXCEED 4GM DAILY  Indications: pain    aspirin EC 81 mg tablet Take 81 mg by mouth daily. Take with food.    atorvastatin (LIPITOR) 40 mg tablet Take one tablet by mouth daily.    lisinopriL-hydrochlorothiazide (ZESTORETIC) 20-12.5 mg tablet Take 1 tablet by mouth daily.    mupirocin (BACTROBAN) 2 % topical ointment Apply  topically to affected area twice daily. Apply to wounds on right plantar foot and right medial knee with twice daily dressing changes.    oxyCODONE (ROXICODONE) 5 mg tablet Take one tablet to two tablets by mouth every 4 hours as needed for Pain Indications: pain, s/p Right Femoral - Popliteal Artery Bypass Graft 11/12/2019    polyethylene glycol 3350 (MIRALAX) 17 g packet Take one packet by mouth daily as needed. Indications: constipation    senna/docusate (SENOKOT-S) 8.6/50 mg tablet Take two tablets by mouth daily as needed. Indications: constipation    vitamins, multiple tablet Take 1 tablet by mouth.    XARELTO 20 mg tablet Take 20 mg by mouth daily with breakfast.          ROS:  Review of Systems   Constitutional: Positive for chills and fever.   HENT: Negative for sore throat.    Eyes: Negative for double vision and photophobia.   Respiratory: Negative for shortness of breath.    Cardiovascular: Negative for chest pain.   Gastrointestinal: Negative for diarrhea, nausea and vomiting.   Genitourinary: Negative for dysuria and frequency.   Musculoskeletal: Positive for myalgias.   Skin: Negative for rash.   Neurological: Negative for focal weakness and headaches.   Endo/Heme/Allergies: Does not bruise/bleed easily.   Psychiatric/Behavioral: The patient is not nervous/anxious.        Vitals: BP: (153)/(69)   Temp:  [36.9 ?C (98.5 ?F)]   Pulse:  [99-101]   Respirations:  [20 PER MINUTE]   SpO2:  [96 %-97 %]     Physical Exam  Constitutional:       General: He is not in acute distress.     Appearance: He is well-developed.   HENT:      Head: Normocephalic and atraumatic.   Eyes:      General: No scleral icterus.  Neck:      Vascular: No JVD.   Cardiovascular:      Rate and Rhythm: Normal rate.      Pulses:           Radial pulses are 2+ on the right side and 2+ on the left side.        Dorsalis pedis pulses are detected w/ Doppler on the right side and detected w/ Doppler on the left side.        Posterior tibial pulses are detected w/ Doppler on the right side and detected w/ Doppler on the left side.      Comments:   Bilateral femoral artery signals detected with doppler  RLE graft signal detected with doppler  Pulmonary:      Effort: Pulmonary effort is normal. No respiratory distress.      Breath sounds: No stridor.   Abdominal:  General: There is no distension.      Palpations: Abdomen is soft.      Tenderness: There is no abdominal tenderness.   Musculoskeletal: Normal range of motion.   Skin:     General: Skin is warm and dry.      Comments: RLE with significant circumferential cellulitis extending from the knee to the ankle  R groin incision with opaque, yellow drainage from the superior aspect of the incision without surrounding erythema or fluctuance   Neurological:      Mental Status: He is alert and oriented to person, place, and time.   Psychiatric:         Behavior: Behavior normal.         Thought Content: Thought content normal.         Judgment: Judgment normal.       Media Information       Document Information    Photographic Image:  Photography-Clinical Mobile   Right lower extremity, anterior medial   01/01/2020 15:34   Attached To:   Hospital Encounter on 01/01/20   Source Information    , Lucienne Capers., MD - Hc5     Media Information       Document Information Photographic Image:  Photography-Clinical Mobile   Right groin   01/01/2020 15:34   Attached To:   Hospital Encounter on 01/01/20   Source Information    , Lucienne Capers., MD - Hc5           Lab/Radiology/Other Diagnostic Tests:  Lab Results   Component Value Date/Time    HGB 12.1 (L) 01/01/2020 1435    HCT 37.6 (L) 01/01/2020 1435    WBC 16.7 (H) 01/01/2020 1435    PLTCT 357 01/01/2020 1435     Lab Results   Component Value Date/Time    NA 139 01/01/2020 1435    K 4.0 01/01/2020 1435    CL 103 01/01/2020 1435    CO2 30 01/01/2020 1435    BUN 14 01/01/2020 1435    CR 0.76 01/01/2020 1435     No orders to display

## 2020-01-01 NOTE — Progress Notes
Patient arrived to room # [phone] via cart accompanied by transport. Patient transferred to the bed with assistance. Bedside safety checks completed. Initial patient assessment completed. Refer to flowsheet for details.    Admission skin assessment completed with: Annice Pih RN    Pressure injury present on arrival?: No    1. Head/Face/Neck: No  2. Trunk/Back: No  3. Upper Extremities: No  4. Lower Extremities: No  5. Pelvic/Coccyx: No  6. Assessed for device associated injury? Yes  7. Malnutrition Screening Tool (Nursing Nutrition Assessment) Completed? Yes    See Doc Flowsheet for additional wound details.     INTERVENTIONS:   Patient has wound on his right leg. No pressure injuries at this time.

## 2020-01-01 NOTE — Progress Notes
Pharmacy Vancomycin Note  Subjective:   Nicholas Rodgers is a 49 y.o. male being treated for recurrent cellulitis.    Objective:     Current Vancomycin Orders   Medication Dose Route Frequency   ? vancomycin (VANCOCIN) 2,000 mg in sodium chloride 0.9% (NS) 290 mL IVPB  2,000 mg Intravenous Q12H*   ? vancomycin, pharmacy to manage  1 each Service Per Pharmacy     Start Date of  vancomycin therapy: 12/31/2019  Additional Abx: Piperacillin/tazobactam     Cultures:  ,  ,  ,    White Blood Cells   Date/Time Value Ref Range Status   01/01/2020 1435 16.7 (H) 4.5 - 11.0 K/UL Final     Creatinine   Date/Time Value Ref Range Status   01/01/2020 1435 0.76 0.4 - 1.24 MG/DL Final     Blood Urea Nitrogen   Date/Time Value Ref Range Status   01/01/2020 1435 14 7 - 25 MG/DL Final     Estimated CrCl: >113ml/min  No intake or output data in the 24 hours ending 01/01/20 1638   UOP:    Actual Weight:  182.5 kg (402 lb 6.4 oz)  Dosing BW:  182.5 kg   Drug Levels:  Vancomycin Random   Date/Time Value Ref Range Status   01/01/2020 1435 13.0 MCG/ML Final       Calculations:  Calculated True Peak (mcg/mL):    Calculated Trough (mcg/mL):     Rate of elimination (h-1):     Half Life (hr):     Volume of distribution (L/kg):    AUC (mcg*h/mL):      Assessment:   Target levels for this patient:  1.  AUC (mcg*h/mL):  400-600    Evaluation of AUC and/or level(s): A vancomycin level was obtained as pt was transferred from outside hospital. Unknown when or what dose was administered to the patient but the level demonstrates that the patient is safe to receive a dose now. Using the [place] vancomycin dosing calculator, a regimen of 2000mg  IV every 12 hours provides a predicted AUC of  417.96 mcg*h/ml and trough of 11.73, assuming Vd is lower due to obesity.     Plan:   1. Will start vancomycin 2000mg  IV every 12 hours   2. Next scheduled level(s): with the 4th dose here   3. Pharmacy will continue to monitor and adjust therapy as needed. Lowanda Foster  PharmD  01/01/2020

## 2020-01-02 MED ORDER — OXYCODONE 5 MG PO TAB
5-10 mg | ORAL | 0 refills | Status: DC | PRN
Start: 2020-01-02 — End: 2020-01-05
  Administered 2020-01-02 – 2020-01-04 (×9): 10 mg via ORAL

## 2020-01-02 MED ORDER — LABETALOL 5 MG/ML IV SYRG
10 mg | INTRAVENOUS | 0 refills | Status: DC | PRN
Start: 2020-01-02 — End: 2020-01-11

## 2020-01-02 MED ORDER — ACETAMINOPHEN 325 MG PO TAB
650 mg | ORAL | 0 refills | Status: DC | PRN
Start: 2020-01-02 — End: 2020-01-05
  Administered 2020-01-02 – 2020-01-04 (×8): 650 mg via ORAL

## 2020-01-02 MED ORDER — HEPARIN (PORCINE) BOLUS FOR CONTINUOUS INF (BAG)
7500 [IU] | Freq: Once | INTRAVENOUS | 0 refills | Status: CP
Start: 2020-01-02 — End: ?

## 2020-01-02 MED ORDER — ATORVASTATIN 40 MG PO TAB
40 mg | Freq: Every day | ORAL | 0 refills | Status: DC
Start: 2020-01-02 — End: 2020-01-11
  Administered 2020-01-02 – 2020-01-11 (×10): 40 mg via ORAL

## 2020-01-02 MED ORDER — VANCOMYCIN 2,000 MG IVPB
2000 mg | Freq: Two times a day (BID) | INTRAVENOUS | 0 refills | Status: DC
Start: 2020-01-02 — End: 2020-01-03
  Administered 2020-01-02 – 2020-01-03 (×6): 2000 mg via INTRAVENOUS

## 2020-01-02 MED ORDER — ASPIRIN 81 MG PO TBEC
81 mg | Freq: Every day | ORAL | 0 refills | Status: DC
Start: 2020-01-02 — End: 2020-01-11
  Administered 2020-01-02 – 2020-01-11 (×9): 81 mg via ORAL

## 2020-01-02 MED ORDER — HEPARIN (PORCINE) IN 5 % DEX 20,000 UNIT/500 ML (40 UNIT/ML) IV SOLP
0-2000 [IU]/h | INTRAVENOUS | 0 refills | Status: DC
Start: 2020-01-02 — End: 2020-01-07
  Administered 2020-01-02: 18:00:00 1700 [IU]/h via INTRAVENOUS
  Administered 2020-01-03: 20:00:00 2000 [IU]/h via INTRAVENOUS
  Administered 2020-01-03 (×2): 1900 [IU]/h via INTRAVENOUS
  Administered 2020-01-04: 04:00:00 2100 [IU]/h via INTRAVENOUS
  Administered 2020-01-05: 20:00:00 2200 [IU]/h via INTRAVENOUS
  Administered 2020-01-06: 16:00:00 2500 [IU]/h via INTRAVENOUS
  Administered 2020-01-06 (×2): 2400 [IU]/h via INTRAVENOUS
  Administered 2020-01-06: 03:00:00 2200 [IU]/h via INTRAVENOUS
  Administered 2020-01-06: 23:00:00 2600 [IU]/h via INTRAVENOUS
  Administered 2020-01-06: 17:00:00 2500 [IU]/h via INTRAVENOUS
  Administered 2020-01-07 (×2): 2600 [IU]/h via INTRAVENOUS

## 2020-01-02 MED ORDER — SENNOSIDES-DOCUSATE SODIUM 8.6-50 MG PO TAB
2 | Freq: Every day | ORAL | 0 refills | Status: DC | PRN
Start: 2020-01-02 — End: 2020-01-11

## 2020-01-02 MED ORDER — POLYETHYLENE GLYCOL 3350 17 GRAM PO PWPK
17 g | Freq: Every day | ORAL | 0 refills | Status: DC | PRN
Start: 2020-01-02 — End: 2020-01-11

## 2020-01-02 MED ORDER — HEPARIN (PORCINE) BOLUS FOR CONTINUOUS INF (BAG)
20-40 [IU]/kg | INTRAVENOUS | 0 refills | Status: DC
Start: 2020-01-02 — End: 2020-01-07

## 2020-01-03 ENCOUNTER — Inpatient Hospital Stay: Admit: 2020-01-03 | Discharge: 2020-01-03 | Payer: BC Managed Care – PPO

## 2020-01-03 ENCOUNTER — Encounter: Admit: 2020-01-03 | Discharge: 2020-01-03 | Payer: BC Managed Care – PPO

## 2020-01-03 MED ORDER — VANCOMYCIN 1,750 MG IVPB
1750 mg | INTRAVENOUS | 0 refills | Status: DC
Start: 2020-01-03 — End: 2020-01-07
  Administered 2020-01-03 – 2020-01-07 (×26): 1750 mg via INTRAVENOUS

## 2020-01-04 ENCOUNTER — Encounter: Admit: 2020-01-04 | Discharge: 2020-01-04 | Payer: BC Managed Care – PPO

## 2020-01-04 ENCOUNTER — Inpatient Hospital Stay: Admit: 2020-01-04 | Discharge: 2020-01-04 | Payer: BC Managed Care – PPO

## 2020-01-04 DIAGNOSIS — I10 Essential (primary) hypertension: Secondary | ICD-10-CM

## 2020-01-04 DIAGNOSIS — I82409 Acute embolism and thrombosis of unspecified deep veins of unspecified lower extremity: Secondary | ICD-10-CM

## 2020-01-04 DIAGNOSIS — G4733 Obstructive sleep apnea (adult) (pediatric): Secondary | ICD-10-CM

## 2020-01-04 DIAGNOSIS — I739 Peripheral vascular disease, unspecified: Secondary | ICD-10-CM

## 2020-01-04 DIAGNOSIS — I2510 Atherosclerotic heart disease of native coronary artery without angina pectoris: Secondary | ICD-10-CM

## 2020-01-04 DIAGNOSIS — E785 Hyperlipidemia, unspecified: Secondary | ICD-10-CM

## 2020-01-04 MED ORDER — FENTANYL CITRATE (PF) 50 MCG/ML IJ SOLN
25 ug | INTRAVENOUS | 0 refills | Status: DC | PRN
Start: 2020-01-04 — End: 2020-01-04
  Administered 2020-01-04 (×4): 25 ug via INTRAVENOUS

## 2020-01-04 MED ORDER — ONDANSETRON HCL (PF) 4 MG/2 ML IJ SOLN
INTRAVENOUS | 0 refills | Status: DC
Start: 2020-01-04 — End: 2020-01-04
  Administered 2020-01-04: 18:00:00 4 mg via INTRAVENOUS

## 2020-01-04 MED ORDER — OXYCODONE 5 MG PO TAB
5-10 mg | ORAL | 0 refills | Status: DC | PRN
Start: 2020-01-04 — End: 2020-01-08
  Administered 2020-01-05 – 2020-01-08 (×20): 10 mg via ORAL

## 2020-01-04 MED ORDER — MIDAZOLAM 1 MG/ML IJ SOLN
INTRAVENOUS | 0 refills | Status: DC
Start: 2020-01-04 — End: 2020-01-04

## 2020-01-04 MED ORDER — DEXTRAN 70-HYPROMELLOSE (PF) 0.1-0.3 % OP DPET
0 refills | Status: DC
Start: 2020-01-04 — End: 2020-01-04
  Administered 2020-01-04: 17:00:00 2 [drp] via OPHTHALMIC

## 2020-01-04 MED ORDER — LIDOCAINE (PF) 10 MG/ML (1 %) IJ SOLN
.1-2 mL | INTRAMUSCULAR | 0 refills | Status: DC | PRN
Start: 2020-01-04 — End: 2020-01-04

## 2020-01-04 MED ORDER — ROCURONIUM 10 MG/ML IV SOLN
INTRAVENOUS | 0 refills | Status: DC
Start: 2020-01-04 — End: 2020-01-04
  Administered 2020-01-04: 17:00:00 30 mg via INTRAVENOUS

## 2020-01-04 MED ORDER — SUGAMMADEX 100 MG/ML IV SOLN
INTRAVENOUS | 0 refills | Status: DC
Start: 2020-01-04 — End: 2020-01-04
  Administered 2020-01-04: 18:00:00 400 mg via INTRAVENOUS

## 2020-01-04 MED ORDER — OXYCODONE 5 MG PO TAB
5-10 mg | Freq: Once | ORAL | 0 refills | Status: CP | PRN
Start: 2020-01-04 — End: ?
  Administered 2020-01-04: 19:00:00 10 mg via ORAL

## 2020-01-04 MED ORDER — DIPHENHYDRAMINE HCL 50 MG/ML IJ SOLN
25 mg | Freq: Once | INTRAVENOUS | 0 refills | Status: DC | PRN
Start: 2020-01-04 — End: 2020-01-04

## 2020-01-04 MED ORDER — ONDANSETRON HCL (PF) 4 MG/2 ML IJ SOLN
4 mg | Freq: Once | INTRAVENOUS | 0 refills | Status: DC | PRN
Start: 2020-01-04 — End: 2020-01-04

## 2020-01-04 MED ORDER — LIDOCAINE (PF) 200 MG/10 ML (2 %) IJ SYRG
0 refills | Status: DC
Start: 2020-01-04 — End: 2020-01-04
  Administered 2020-01-04: 17:00:00 100 mg via INTRAVENOUS

## 2020-01-04 MED ORDER — PROPOFOL INJ 10 MG/ML IV VIAL
0 refills | Status: DC
Start: 2020-01-04 — End: 2020-01-04
  Administered 2020-01-04: 17:00:00 200 mg via INTRAVENOUS

## 2020-01-04 MED ORDER — SUCCINYLCHOLINE CHLORIDE 20 MG/ML IJ SOLN
INTRAVENOUS | 0 refills | Status: DC
Start: 2020-01-04 — End: 2020-01-04
  Administered 2020-01-04: 17:00:00 180 mg via INTRAVENOUS

## 2020-01-04 MED ORDER — ACETAMINOPHEN 325 MG PO TAB
650 mg | ORAL | 0 refills | Status: DC
Start: 2020-01-04 — End: 2020-01-11
  Administered 2020-01-05 – 2020-01-09 (×26): 650 mg via ORAL
  Administered 2020-01-09: 09:00:00 325 mg via ORAL
  Administered 2020-01-10 – 2020-01-11 (×9): 650 mg via ORAL

## 2020-01-04 MED ORDER — FENTANYL CITRATE (PF) 50 MCG/ML IJ SOLN
0 refills | Status: DC
Start: 2020-01-04 — End: 2020-01-04
  Administered 2020-01-04 (×2): 50 ug via INTRAVENOUS

## 2020-01-04 MED ORDER — DEXAMETHASONE SODIUM PHOSPHATE 4 MG/ML IJ SOLN
INTRAVENOUS | 0 refills | Status: DC
Start: 2020-01-04 — End: 2020-01-04
  Administered 2020-01-04: 17:00:00 4 mg via INTRAVENOUS

## 2020-01-04 MED ORDER — LACTATED RINGERS IV SOLP
1000 mL | INTRAVENOUS | 0 refills | Status: AC
Start: 2020-01-04 — End: ?

## 2020-01-04 MED ADMIN — LACTATED RINGERS IV SOLP [4318]: 1000 mL | INTRAVENOUS | @ 16:00:00 | Stop: 2020-01-04 | NDC 00338011704

## 2020-01-05 NOTE — Progress Notes
Home Infusion Benefits    Therapy(s) quoted: Vancomycin 1,754m Q8h Drug Copay: $67.92    Therapy(s) quoted: Zosyn 4.5g Q6h  Drug Copay: $73.02    Supply Copay: $130.20    Total estimated expense for 7 days of therapy: $271.14, after remaining deductible of $750.00 is met

## 2020-01-06 ENCOUNTER — Encounter: Admit: 2020-01-06 | Discharge: 2020-01-06 | Payer: BC Managed Care – PPO

## 2020-01-06 DIAGNOSIS — I2510 Atherosclerotic heart disease of native coronary artery without angina pectoris: Secondary | ICD-10-CM

## 2020-01-06 DIAGNOSIS — E785 Hyperlipidemia, unspecified: Secondary | ICD-10-CM

## 2020-01-06 DIAGNOSIS — G4733 Obstructive sleep apnea (adult) (pediatric): Secondary | ICD-10-CM

## 2020-01-06 DIAGNOSIS — I10 Essential (primary) hypertension: Secondary | ICD-10-CM

## 2020-01-06 DIAGNOSIS — I739 Peripheral vascular disease, unspecified: Secondary | ICD-10-CM

## 2020-01-06 DIAGNOSIS — I82409 Acute embolism and thrombosis of unspecified deep veins of unspecified lower extremity: Secondary | ICD-10-CM

## 2020-01-07 ENCOUNTER — Encounter: Admit: 2020-01-07 | Discharge: 2020-01-07 | Payer: BC Managed Care – PPO

## 2020-01-07 MED ORDER — CEFTRIAXONE INJ 2GM IVP
2 g | INTRAVENOUS | 0 refills | Status: DC
Start: 2020-01-07 — End: 2020-01-11
  Administered 2020-01-07 – 2020-01-10 (×4): 2 g via INTRAVENOUS

## 2020-01-07 MED ORDER — RIVAROXABAN 20 MG PO TAB
20 mg | Freq: Every day | ORAL | 0 refills | Status: DC
Start: 2020-01-07 — End: 2020-01-11
  Administered 2020-01-07 – 2020-01-11 (×5): 20 mg via ORAL

## 2020-01-07 MED ORDER — LISINOPRIL 20 MG PO TAB
20 mg | Freq: Every day | ORAL | 0 refills | Status: DC
Start: 2020-01-07 — End: 2020-01-11
  Administered 2020-01-07 – 2020-01-11 (×5): 20 mg via ORAL

## 2020-01-07 NOTE — Telephone Encounter
FMLA paperwork completed, signed and return fax to number requested.  Patient notified

## 2020-01-08 MED ORDER — OXYCODONE 5 MG PO TAB
5-15 mg | ORAL | 0 refills | Status: DC | PRN
Start: 2020-01-08 — End: 2020-01-11
  Administered 2020-01-08 – 2020-01-11 (×15): 15 mg via ORAL
  Administered 2020-01-11: 02:00:00 10 mg via ORAL
  Administered 2020-01-11 (×2): 15 mg via ORAL

## 2020-01-11 ENCOUNTER — Encounter: Admit: 2020-01-11 | Discharge: 2020-01-11 | Payer: BC Managed Care – PPO

## 2020-01-11 MED ORDER — NALOXONE 4 MG/ACTUATION NA SPRY
4 mg | Freq: Once | NASAL | 0 refills | 1.00000 days | Status: AC
Start: 2020-01-11 — End: ?
  Filled 2020-01-11: qty 2, 2d supply, fill #1

## 2020-01-11 MED ORDER — AMOXICILLIN 500 MG PO CAP
1000 mg | ORAL_CAPSULE | ORAL | 0 refills | 7.00000 days | Status: DC
Start: 2020-01-11 — End: 2020-01-11
  Filled 2020-01-11: qty 84, 14d supply, fill #1

## 2020-01-11 MED ORDER — SENNOSIDES-DOCUSATE SODIUM 8.6-50 MG PO TAB
2 | Freq: Every day | ORAL | 0 refills | Status: DC | PRN
Start: 2020-01-11 — End: 2020-02-03

## 2020-01-11 MED ORDER — POLYETHYLENE GLYCOL 3350 17 GRAM PO PWPK
17 g | Freq: Every day | ORAL | 0 refills | 18.00000 days | Status: DC | PRN
Start: 2020-01-11 — End: 2020-02-03

## 2020-01-11 MED ORDER — OXYCODONE 5 MG PO TAB
5-10 mg | ORAL_TABLET | ORAL | 0 refills | 6.00000 days | Status: DC | PRN
Start: 2020-01-11 — End: 2020-01-20
  Filled 2020-01-11: qty 50, 5d supply, fill #1

## 2020-01-11 MED ORDER — AMOXICILLIN 500 MG PO CAP
1000 mg | ORAL_CAPSULE | ORAL | 0 refills | 7.00000 days | Status: DC
Start: 2020-01-11 — End: 2020-01-20

## 2020-01-11 MED ORDER — XARELTO 20 MG PO TAB
20 mg | ORAL_TABLET | Freq: Every day | ORAL | 3 refills | 30.00000 days | Status: DC
Start: 2020-01-11 — End: 2020-04-04
  Filled 2020-01-11: qty 90, 30d supply, fill #1

## 2020-01-18 ENCOUNTER — Encounter: Admit: 2020-01-18 | Discharge: 2020-01-18 | Payer: BC Managed Care – PPO

## 2020-01-18 NOTE — Telephone Encounter
I received a call from the patient's home health nurse stating that the patient has a rash all over his body and she directed him to stop the amoxicillin. I attempted to contact her back at the number provided [phone]) to discuss, I was unable to reach the home health nurse. I contacted the patient who stated that another antibiotic was started (unsure by who) and that he is taking Benadryl. The rash seems to be improving.

## 2020-01-20 ENCOUNTER — Encounter: Admit: 2020-01-20 | Discharge: 2020-01-20 | Payer: BC Managed Care – PPO

## 2020-01-20 ENCOUNTER — Ambulatory Visit: Admit: 2020-01-20 | Discharge: 2020-01-21 | Payer: BC Managed Care – PPO

## 2020-01-20 DIAGNOSIS — I724 Aneurysm of artery of lower extremity: Secondary | ICD-10-CM

## 2020-01-20 DIAGNOSIS — I739 Peripheral vascular disease, unspecified: Secondary | ICD-10-CM

## 2020-01-20 DIAGNOSIS — I82409 Acute embolism and thrombosis of unspecified deep veins of unspecified lower extremity: Secondary | ICD-10-CM

## 2020-01-20 DIAGNOSIS — I10 Essential (primary) hypertension: Secondary | ICD-10-CM

## 2020-01-20 DIAGNOSIS — I2510 Atherosclerotic heart disease of native coronary artery without angina pectoris: Secondary | ICD-10-CM

## 2020-01-20 DIAGNOSIS — E785 Hyperlipidemia, unspecified: Secondary | ICD-10-CM

## 2020-01-20 DIAGNOSIS — G4733 Obstructive sleep apnea (adult) (pediatric): Secondary | ICD-10-CM

## 2020-01-20 MED ORDER — OXYCODONE 5 MG PO TAB
5-10 mg | ORAL_TABLET | Freq: Two times a day (BID) | ORAL | 0 refills | 6.00000 days | Status: AC | PRN
Start: 2020-01-20 — End: ?

## 2020-01-20 NOTE — Progress Notes
Date of Service: 01/20/2020              Chief Complaint   Patient presents with   ? Post Operative Visit       History of Present Illness    He is a 49 year old male with peripheral arterial disease who underwent a right femoral to distal popliteal bypass for an occluded popliteal artery aneurysm and significant leg pain.  He presented more recently with right lower leg swelling and wound drainage.  He had a Streptococcus infection with limited abscess of his medial calf wound.  He underwent surgical drainage, and has had a wound VAC applied today since discharge.  He was initially discharged home on Augmentin, but developed a rash.  He was subsequently switched to Bactrim.  He denies any problems with Bactrim since starting it a few days ago.  He denies any fevers or chills.  He denies any drainage from his medial thigh wounds.  Home health has been doing his dressing changes 3 times a week.           Medical History:   Diagnosis Date   ? Coronary artery disease    ? Deep vein thrombosis (HCC)     bilateral lower extrmities   ? Hyperlipidemia    ? Hypertension    ? OSA on CPAP    ? Peripheral artery disease [facility])        Surgical History:   Procedure Laterality Date   ? BYPASS GRAFT FEMORAL-POPLITEAL ARTERY Right 11/12/2019    Performed by Lonell Grandchild, MD at [facility] [place]   ? INCISION AND DRAINAGE ABSCESS COMPLICATED/ MULTIPLE - LOWER EXTREMITY WITH PLACEMENT OF WOUND VAC Right 01/04/2020    Performed by Lonell Grandchild, MD at [facility] [place]   ? HX CORONARY ARTERY BYPASS GRAFT  ~2004    triple bypass       Allergies:  No Known Allergies    Medication List:  ? acetaminophen (TYLENOL) 325 mg tablet Take two tablets by mouth every 4 hours as needed for Pain. TOTAL ACETAMINOPHEN DOSE NOT TO EXCEED 4GM DAILY  Indications: pain   ? aspirin EC 81 mg tablet Take 81 mg by mouth daily. Take with food.   ? atorvastatin (LIPITOR) 40 mg tablet Take one tablet by mouth daily.   ? cephalexin (KEFLEX) 500 mg capsule Take 500 mg by mouth. ? lisinopriL-hydrochlorothiazide (ZESTORETIC) 20-12.5 mg tablet Take 1 tablet by mouth daily.   ? nicotine (NICODERM CQ STEP 2) 14 mg/day patch APPLY 1 PATCH TOPICALLY TO SKIN ONCE DAILY   ? oxyCODONE (ROXICODONE) 5 mg tablet Take one tablet to two tablets by mouth every 12 hours as needed Indications: pain   ? polyethylene glycol 3350 (MIRALAX) 17 g packet Take one packet by mouth daily as needed. Indications: constipation   ? ramipriL (ALTACE) 2.5 mg capsule Take  by mouth daily.   ? senna/docusate (SENOKOT-S) 8.6/50 mg tablet Take two tablets by mouth daily as needed. Indications: constipation   ? vitamins, multiple tablet Take 1 tablet by mouth daily.   ? XARELTO 20 mg tablet Take one tablet by mouth daily with breakfast.       Social History:   reports that he has been smoking cigarettes. He has a 17.50 pack-year smoking history. He has never used smokeless tobacco. He reports current alcohol use. He reports that he does not use drugs.    Family History   Problem Relation Age of Onset   ? Heart Disease  Father    ? Cancer Father    ? Cancer Maternal Grandfather        Review of Systems   Constitutional: Negative.    HENT: Negative.    Eyes: Negative.    Respiratory: Negative.    Cardiovascular: Negative.    Gastrointestinal: Negative.    Endocrine: Negative.    Genitourinary: Negative.    Musculoskeletal: Negative.    Skin: Positive for wound.   Allergic/Immunologic: Negative.    Neurological: Positive for numbness.   Hematological: Negative.    Psychiatric/Behavioral: Negative.                Vitals:    01/20/20 0959   BP: 126/84   BP Source: Arm, Left Upper   Patient Position: Sitting   Pulse: 92   Resp: 18   Temp: 36.6 ?C (97.9 ?F)   TempSrc: Oral   Weight: (!) 181.4 kg (400 lb)   Height: 182.9 cm (72)   PainSc: Four     Body mass index is 54.25 kg/m?Marland Kitchen     Physical Exam  Vitals signs and nursing note reviewed.   Constitutional:       General: He is not in acute distress.  Neck: Musculoskeletal: Neck supple.   Cardiovascular:      Rate and Rhythm: Normal rate and regular rhythm.   Pulmonary:      Effort: Pulmonary effort is normal. No respiratory distress.   Abdominal:      General: There is no distension.      Palpations: Abdomen is soft.   Musculoskeletal:      Comments: Good pulse in the left leg bypass  Edema and erythema of the right lower leg significantly improved.  Medial calf wound measure 10cm x 2cm x 0.8cm depth with granulating base, no surrounding erythema or fluctuance  Two small circular ulcers on medial thigh measuring about 0.5cm in diameter and depth, no erythema   Neurological:      General: No focal deficit present.      Mental Status: He is alert and oriented to person, place, and time.   Psychiatric:         Mood and Affect: Mood normal.         Behavior: Behavior normal.             Assessment and Plan:    1. PAD (peripheral artery disease) (HCC)     2. Popliteal artery aneurysm, bilateral (HCC)     3. Ulcer of right foot with fat layer exposed (HCC)     4. Morbid obesity (HCC)       He is doing well following incision and drainage of abscess along the medial right calf.  This did not go down to the graft itself.  Would continue using a wound VAC to the right medial calf wound.  This was reapplied today in clinic.  Would use Aquacel Ag packed into the more proximal thigh wounds.  Continue Bactrim until prescription is complete.  I have asked him to return to see [place] in 2 to 3 weeks for reevaluation.    Lonell Grandchild, MD

## 2020-01-21 DIAGNOSIS — I739 Peripheral vascular disease, unspecified: Secondary | ICD-10-CM

## 2020-01-21 DIAGNOSIS — E782 Mixed hyperlipidemia: Secondary | ICD-10-CM

## 2020-01-21 DIAGNOSIS — L97512 Non-pressure chronic ulcer of other part of right foot with fat layer exposed: Secondary | ICD-10-CM

## 2020-01-21 DIAGNOSIS — I10 Essential (primary) hypertension: Secondary | ICD-10-CM

## 2020-02-03 ENCOUNTER — Encounter: Admit: 2020-02-03 | Discharge: 2020-02-03 | Payer: BC Managed Care – PPO

## 2020-02-03 ENCOUNTER — Ambulatory Visit: Admit: 2020-02-03 | Discharge: 2020-02-04 | Payer: BC Managed Care – PPO

## 2020-02-03 DIAGNOSIS — I2510 Atherosclerotic heart disease of native coronary artery without angina pectoris: Secondary | ICD-10-CM

## 2020-02-03 DIAGNOSIS — I739 Peripheral vascular disease, unspecified: Secondary | ICD-10-CM

## 2020-02-03 DIAGNOSIS — E782 Mixed hyperlipidemia: Secondary | ICD-10-CM

## 2020-02-03 DIAGNOSIS — E785 Hyperlipidemia, unspecified: Secondary | ICD-10-CM

## 2020-02-03 DIAGNOSIS — I82409 Acute embolism and thrombosis of unspecified deep veins of unspecified lower extremity: Secondary | ICD-10-CM

## 2020-02-03 DIAGNOSIS — I10 Essential (primary) hypertension: Secondary | ICD-10-CM

## 2020-02-03 DIAGNOSIS — G4733 Obstructive sleep apnea (adult) (pediatric): Secondary | ICD-10-CM

## 2020-02-03 DIAGNOSIS — L97912 Non-pressure chronic ulcer of unspecified part of right lower leg with fat layer exposed: Secondary | ICD-10-CM

## 2020-02-03 DIAGNOSIS — I724 Aneurysm of artery of lower extremity: Secondary | ICD-10-CM

## 2020-02-03 MED ORDER — HYDROCODONE-ACETAMINOPHEN 5-325 MG PO TAB
1 | ORAL_TABLET | ORAL | 0 refills | 30.00000 days | Status: AC | PRN
Start: 2020-02-03 — End: ?

## 2020-02-03 NOTE — Progress Notes
Date of Service: 02/03/2020              Chief Complaint   Patient presents with   ? Wound Check     10.5 cm x 1.2 cm x 0.3 cm       History of Present Illness    He is a 49 year old male with hypertension, hyperlipidemia, and history of venous thrombosis who returns for routine follow-up of a right femoral to popliteal artery bypass for leg pain and occluded right popliteal artery aneurysm.  This was complicated by an infected seroma in his right lower leg.  This required surgical debridement and wound VAC placement.  Cultures grew Streptococcus.  He was on IV antibiotics, and eventually discharged on oral antibiotics.  He had an adverse reaction to Augmentin, and was changed to Bactrim.  He finished the Bactrim last week.  He denies any fevers or chills.  He continues to have discomfort in his right medial lower leg.  No foot pain at this time.  He has a wound VAC to the right medial leg wound.  Home health is assisting with his dressing changes.           Medical History:   Diagnosis Date   ? Coronary artery disease    ? Deep vein thrombosis (HCC)     bilateral lower extrmities   ? Hyperlipidemia    ? Hypertension    ? OSA on CPAP    ? Peripheral artery disease [facility])        Surgical History:   Procedure Laterality Date   ? BYPASS GRAFT FEMORAL-POPLITEAL ARTERY Right 11/12/2019    Performed by Lonell Grandchild, MD at [facility] [place]   ? INCISION AND DRAINAGE ABSCESS COMPLICATED/ MULTIPLE - LOWER EXTREMITY WITH PLACEMENT OF WOUND VAC Right 01/04/2020    Performed by Lonell Grandchild, MD at [facility]   ? HX CORONARY ARTERY BYPASS GRAFT  ~2004    triple bypass       Allergies:  Allergies   Allergen Reactions   ? Penicillin G RASH and EDEMA       Medication List:  ? acetaminophen (TYLENOL) 325 mg tablet Take two tablets by mouth every 4 hours as needed for Pain. TOTAL ACETAMINOPHEN DOSE NOT TO EXCEED 4GM DAILY  Indications: pain   ? aspirin EC 81 mg tablet Take 81 mg by mouth daily. Take with food. ? atorvastatin (LIPITOR) 40 mg tablet Take one tablet by mouth daily.   ? HYDROcodone/acetaminophen (NORCO) 5/325 mg tablet Take one tablet by mouth every 6 hours as needed for Pain   ? lisinopriL-hydrochlorothiazide (ZESTORETIC) 20-12.5 mg tablet Take 1 tablet by mouth daily.   ? oxyCODONE (ROXICODONE) 5 mg tablet Take one tablet to two tablets by mouth every 12 hours as needed Indications: pain   ? vitamins, multiple tablet Take 1 tablet by mouth daily.   ? XARELTO 20 mg tablet Take one tablet by mouth daily with breakfast.       Social History:   reports that he has been smoking cigarettes. He has a 17.50 pack-year smoking history. He has never used smokeless tobacco. He reports current alcohol use. He reports that he does not use drugs.    Family History   Problem Relation Age of Onset   ? Heart Disease Father    ? Cancer Father    ? Cancer Maternal Grandfather        Review of Systems   Constitutional: Negative for activity change, appetite  change, chills, diaphoresis, fatigue, fever and unexpected weight change.   HENT: Negative for hearing loss, nosebleeds and tinnitus.    Eyes: Negative for photophobia, itching and visual disturbance.   Respiratory: Negative for apnea, cough, chest tightness and shortness of breath.    Cardiovascular: Negative for chest pain, palpitations and leg swelling.   Gastrointestinal: Negative for abdominal pain, blood in stool, constipation, diarrhea, nausea and vomiting.   Endocrine: Negative for cold intolerance and heat intolerance.   Genitourinary: Negative for dysuria, frequency, hematuria and urgency.   Musculoskeletal: Negative for arthralgias, back pain, gait problem, joint swelling, myalgias, neck pain and neck stiffness.   Skin: Positive for wound. Negative for color change and rash.   Allergic/Immunologic: Negative for environmental allergies, food allergies and immunocompromised state. Neurological: Negative for dizziness, tremors, seizures, syncope, facial asymmetry, speech difficulty, weakness, light-headedness, numbness and headaches.   Hematological: Negative for adenopathy. Does not bruise/bleed easily.   Psychiatric/Behavioral: Negative for behavioral problems, confusion and dysphoric mood. The patient is not nervous/anxious.                Vitals:    02/03/20 1130   Weight: (!) 181.4 kg (400 lb)   Height: 182.9 cm (72)     Body mass index is 54.25 kg/m?Marland Kitchen     Physical Exam  Vitals signs and nursing note reviewed.   Constitutional:       General: He is not in acute distress.  Neck:      Musculoskeletal: Neck supple.   Cardiovascular:      Rate and Rhythm: Normal rate and regular rhythm.   Pulmonary:      Effort: Pulmonary effort is normal. No respiratory distress.   Abdominal:      Palpations: Abdomen is soft.      Tenderness: There is no abdominal tenderness.   Musculoskeletal:      Comments: Right medial lower leg wound contracting nicely with granulating base, measurements 10.5 cm x 1.2 cm x 0.3 cm depth, no surrounding fluctuance, some inferior and medial hyperemia with skin maceration likely due to moisture from his adjacent wound VAC.  Palpable pulse within right leg bypass  No areas of skin breakdown on either foot   Neurological:      General: No focal deficit present.      Mental Status: He is alert and oriented to person, place, and time.   Psychiatric:         Mood and Affect: Mood normal.         Behavior: Behavior normal.             Assessment and Plan:    1. 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 Peripheral vascular disease (HCC)     3. Popliteal artery aneurysm, bilateral (HCC)     4. Mixed hyperlipidemia     5. Essential hypertension He continues to steadily improve following right femoral-popliteal bypass for occluded popliteal artery aneurysm.  The right medial calf wound is healing nicely.  May pause use of wound VAC and start using Aquacel Ag to the wound base.  Hopefully, this will help alleviate some of the maceration of the adjacent skin.  If his wound continues to look good in a week, may DC wound VAC and send back to facility.  He may increase activity as tolerated.  Follow-up with me in approximately 3 weeks.  Will need right leg bypass ultrasound in 2 to 3 months.  Lonell Grandchild, MD

## 2020-02-09 ENCOUNTER — Encounter: Admit: 2020-02-09 | Discharge: 2020-02-09 | Payer: BC Managed Care – PPO

## 2020-02-09 NOTE — Telephone Encounter
Images received and reviewed, the patient is scheduled for an office visit tomorrow.

## 2020-02-09 NOTE — Telephone Encounter
Krisit w [facility] [phone]) called asking for wound care orders and worsening leg issues.  Reviewed dictation by Dr. Hollie Beach from 2.4.21, she states despite those changes his leg has continued to worsen, increased maceration, pain, swelling, redness and warmth to the tough.  Pics being sent and will review w Dr. Naomie Dean in clinic and will follow up w Nicholas Rodgers.

## 2020-02-10 ENCOUNTER — Encounter: Admit: 2020-02-10 | Discharge: 2020-02-10 | Payer: BC Managed Care – PPO

## 2020-02-10 ENCOUNTER — Ambulatory Visit: Admit: 2020-02-10 | Discharge: 2020-02-10 | Payer: BC Managed Care – PPO

## 2020-02-10 DIAGNOSIS — Z9889 Other specified postprocedural states: Secondary | ICD-10-CM

## 2020-02-10 DIAGNOSIS — I10 Essential (primary) hypertension: Secondary | ICD-10-CM

## 2020-02-10 DIAGNOSIS — I82409 Acute embolism and thrombosis of unspecified deep veins of unspecified lower extremity: Secondary | ICD-10-CM

## 2020-02-10 DIAGNOSIS — G4733 Obstructive sleep apnea (adult) (pediatric): Secondary | ICD-10-CM

## 2020-02-10 DIAGNOSIS — I2510 Atherosclerotic heart disease of native coronary artery without angina pectoris: Secondary | ICD-10-CM

## 2020-02-10 DIAGNOSIS — S81801D Unspecified open wound, right lower leg, subsequent encounter: Secondary | ICD-10-CM

## 2020-02-10 DIAGNOSIS — E785 Hyperlipidemia, unspecified: Secondary | ICD-10-CM

## 2020-02-10 DIAGNOSIS — I739 Peripheral vascular disease, unspecified: Secondary | ICD-10-CM

## 2020-02-10 MED ORDER — CEPHALEXIN 500 MG PO CAP
500 mg | ORAL_CAPSULE | Freq: Three times a day (TID) | ORAL | 0 refills | Status: AC
Start: 2020-02-10 — End: ?

## 2020-02-10 NOTE — Progress Notes
Date of Service: 02/10/2020              Chief Complaint   Patient presents with   ? Wound Check       History of Present Illness    He is a 49 year old male with hypertension, hyperlipidemia, and history of venous thrombosis who returns for routine follow-up of a right femoral to popliteal artery bypass for leg pain and occluded right popliteal artery aneurysm.  This was complicated by an infected seroma in his right lower leg.  This required surgical debridement and wound VAC placement.  Cultures grew Streptococcus.  He was on IV antibiotics, and eventually discharged on oral antibiotics.  He is no longer on antibiotics.      He denies any fevers or chills.  He continues to have discomfort in his right medial lower leg.  No foot pain at this time.  He complains of some right lower leg edema and some redness in his lower shin.  He has been using antibiotic ointment this area.    He was given a break from his wound VAC at his last visit on February 4 of 2021.  Home health is assisting with his Aquacel Ag dressing changes.           Medical History:   Diagnosis Date   ? Coronary artery disease    ? Deep vein thrombosis (HCC)     bilateral lower extrmities   ? Hyperlipidemia    ? Hypertension    ? OSA on CPAP    ? Peripheral artery disease [facility])        Surgical History:   Procedure Laterality Date   ? BYPASS GRAFT FEMORAL-POPLITEAL ARTERY Right 11/12/2019    Performed by Lonell Grandchild, MD at [facility] OR   ? INCISION AND DRAINAGE ABSCESS COMPLICATED/ MULTIPLE - LOWER EXTREMITY WITH PLACEMENT OF WOUND VAC Right 01/04/2020    Performed by Lonell Grandchild, [facility] at [facility] [place]   ? HX CORONARY ARTERY BYPASS GRAFT  ~2004    triple bypass       Allergies:  Allergies   Allergen Reactions   ? Penicillin G RASH and EDEMA       Medication List:  ? acetaminophen (TYLENOL) 325 mg tablet Take two tablets by mouth every 4 hours as needed for Pain. TOTAL ACETAMINOPHEN DOSE NOT TO EXCEED 4GM DAILY  Indications: pain   ? aspirin EC 81 mg tablet Take 81 mg by mouth daily. Take with food.   ? atorvastatin (LIPITOR) 40 mg tablet Take one tablet by mouth daily.   ? cephalexin (KEFLEX) 500 mg capsule Take one capsule by mouth three times daily for 14 days.   ? HYDROcodone/acetaminophen (NORCO) 5/325 mg tablet Take one tablet by mouth every 6 hours as needed for Pain   ? lisinopriL-hydrochlorothiazide (ZESTORETIC) 20-12.5 mg tablet Take 1 tablet by mouth daily.   ? oxyCODONE (ROXICODONE) 5 mg tablet Take one tablet to two tablets by mouth every 12 hours as needed Indications: pain   ? vitamins, multiple tablet Take 1 tablet by mouth daily.   ? XARELTO 20 mg tablet Take one tablet by mouth daily with breakfast.       Social History:   reports that he has been smoking cigarettes. He has a 17.50 pack-year smoking history. He has never used smokeless tobacco. He reports current alcohol use. He reports that he does not use drugs.    Family History   Problem Relation Age of Onset   ?  Heart Disease Father    ? Cancer Father    ? Cancer Maternal Grandfather        Review of Systems   Constitutional: Negative for activity change, appetite change, chills, diaphoresis, fatigue, fever and unexpected weight change.   HENT: Negative for hearing loss, nosebleeds and tinnitus.    Eyes: Negative for photophobia, itching and visual disturbance.   Respiratory: Negative for apnea, cough, chest tightness and shortness of breath.    Cardiovascular: Negative for chest pain, palpitations and leg swelling.   Gastrointestinal: Negative for abdominal pain, blood in stool, constipation, diarrhea, nausea and vomiting.   Endocrine: Negative for cold intolerance and heat intolerance.   Genitourinary: Negative for dysuria, frequency and hematuria.   Musculoskeletal: Negative for arthralgias, back pain, gait problem, joint swelling and myalgias.   Skin: Positive for wound. Negative for color change and rash.   Allergic/Immunologic: Negative for environmental allergies, food allergies and immunocompromised state.   Neurological: Negative for dizziness, tremors, seizures, syncope, facial asymmetry, speech difficulty, weakness, light-headedness and headaches.   Hematological: Negative for adenopathy. Does not bruise/bleed easily.   Psychiatric/Behavioral: Negative for behavioral problems, confusion and dysphoric mood. The patient is not nervous/anxious.    All other systems reviewed and are negative.              Vitals:    02/10/20 1028   BP: 115/78   BP Source: Arm, Right Upper   Patient Position: Sitting   Pulse: 101   Temp: 36.6 ?C (97.9 ?F)   TempSrc: Oral   Weight: (!) 181.4 kg (400 lb)   Height: 182.9 cm (72)     Body mass index is 54.25 kg/m?Marland Kitchen     Physical Exam  Vitals signs and nursing note reviewed.   Constitutional:       General: He is not in acute distress.     Appearance: He is well-developed. He is morbidly obese. He is not ill-appearing or diaphoretic.   HENT:      Head: Normocephalic.   Eyes:      General:         Right eye: No discharge.         Left eye: No discharge.   Neck:      Trachea: Phonation normal.   Cardiovascular:      Rate and Rhythm: Normal rate and regular rhythm.      Pulses:           Dorsalis pedis pulses are detected w/ Doppler on the right side and detected w/ Doppler on the left side.        Posterior tibial pulses are detected w/ Doppler on the left side.      Comments: His right lower leg is edematous.  He has some changes of venous stasis in his anterior leg.  Pulmonary:      Effort: Pulmonary effort is normal. No respiratory distress.   Feet:      Right foot:      Skin integrity: Skin integrity normal.      Left foot:      Skin integrity: Skin integrity normal.   Skin:     General: Skin is warm and dry.      Capillary Refill: Capillary refill takes less than 2 seconds.      Coloration: Skin is not cyanotic or mottled.      Comments: Right medial lower leg wound contracting nicely with granulating base, measurements 12 cm x 1.5 cm x 0.3 cm depth. Yellow  slough tissue was wiped out with gauze.  This was redressed with Aquacel Ag and gauze.  He has redness in his right anterior shin.  He complains of some blister formation in this area as well.   Neurological:      General: No focal deficit present.      Mental Status: He is alert and oriented to person, place, and time.      Motor: Motor function is intact.      Coordination: Coordination is intact.      Gait: Gait is intact.   Psychiatric:         Attention and Perception: Attention normal.         Mood and Affect: Mood normal.         Speech: Speech normal.         Behavior: Behavior normal. Behavior is cooperative.         Thought Content: Thought content normal.             Assessment and Plan:    1. PAD (peripheral artery disease) (HCC)  US DOPPLER ART LE RIGHT    [place] NONINVASIVE LOWER EXT PVR   2. H/O peripheral artery bypass  US DOPPLER ART LE RIGHT    [place] NONINVASIVE LOWER EXT PVR   3. Open wound of leg, right, subsequent encounter     4. Morbid obesity (HCC)         He continues to steadily improve following right femoral-popliteal bypass for occluded popliteal artery aneurysm.  The right medial calf wound is healing nicely.  We discussed light weight compression to help control right lower leg swelling.  We will start Keflex for concerns of anterior shin cellulitic changes.    Continue using Aquacel Ag to the wound base.       Start  Keflex 500 mg 3 times a day.    He may increase activity as tolerated.  Light weight right lower leg compression and elevation to control swelling.    Follow-up with Dr. Hollie Beach in approximately 3 weeks.      Will need right leg bypass ultrasound in ~April 2021.    Kathaleen Maser, APRN-NP

## 2020-02-18 ENCOUNTER — Encounter: Admit: 2020-02-18 | Discharge: 2020-02-18 | Payer: BC Managed Care – PPO

## 2020-02-18 NOTE — Telephone Encounter
Patients Home health nurse called wanting to get frequency changed on skilled nursing to once a week. Call back number is [phone]

## 2020-02-18 NOTE — Telephone Encounter
I spoke with Neysa Bonito, the home health nurse. The patient is comfortable doing dressing changes himself so the okay was given for them to see him once weekly.

## 2020-02-24 ENCOUNTER — Encounter: Admit: 2020-02-24 | Discharge: 2020-02-24 | Payer: BC Managed Care – PPO

## 2020-02-24 ENCOUNTER — Ambulatory Visit: Admit: 2020-02-24 | Discharge: 2020-02-25 | Payer: BC Managed Care – PPO

## 2020-02-24 DIAGNOSIS — Z8639 Personal history of other endocrine, nutritional and metabolic disease: Secondary | ICD-10-CM

## 2020-02-24 DIAGNOSIS — E785 Hyperlipidemia, unspecified: Secondary | ICD-10-CM

## 2020-02-24 DIAGNOSIS — I82409 Acute embolism and thrombosis of unspecified deep veins of unspecified lower extremity: Secondary | ICD-10-CM

## 2020-02-24 DIAGNOSIS — I10 Essential (primary) hypertension: Secondary | ICD-10-CM

## 2020-02-24 DIAGNOSIS — G4733 Obstructive sleep apnea (adult) (pediatric): Secondary | ICD-10-CM

## 2020-02-24 DIAGNOSIS — I739 Peripheral vascular disease, unspecified: Secondary | ICD-10-CM

## 2020-02-24 DIAGNOSIS — I2510 Atherosclerotic heart disease of native coronary artery without angina pectoris: Secondary | ICD-10-CM

## 2020-02-24 DIAGNOSIS — E782 Mixed hyperlipidemia: Secondary | ICD-10-CM

## 2020-02-24 DIAGNOSIS — Z9889 Other specified postprocedural states: Secondary | ICD-10-CM

## 2020-02-24 DIAGNOSIS — S81801D Unspecified open wound, right lower leg, subsequent encounter: Secondary | ICD-10-CM

## 2020-02-24 NOTE — Progress Notes
Date of Service: 02/24/2020              Chief Complaint   Patient presents with   ? Post Operative Visit       History of Present Illness    Nicholas is a 49 year old [place] with hypertension, hyperlipidemia, and history of venous thrombosis who returns for follow-up evaluation on his right lower leg wound.  Nicholas is postop right femoral to popliteal artery bypass 11/29/2019 for leg pain and occluded right popliteal artery aneurysm. This was complicated by an infected seroma in his right lower leg  requiring surgical debridement on 01/04/2020 .  Nicholas has been using Aquacel Ag and gauze to the wound.    Nicholas denies any fevers or chills.  Nicholas continues to have mild discomfort in his right medial lower leg.  No foot pain at this time.  Nicholas is walking better and states his leg feels better with compression.    Nicholas continues to smoke but continues to reduce his smoking.  Nicholas continues on aspirin, Xarelto and Lipitor daily.    Nicholas has no documented hemoglobin A1c and no documented history of diabetes although all of his blood sugars on his basic metabolic panels are high, and Nicholas is not on medication for hyperglycemia..           Medical History:   Diagnosis Date   ? Coronary artery disease    ? Deep vein thrombosis (HCC)     bilateral lower extrmities   ? Hyperlipidemia    ? Hypertension    ? OSA on CPAP    ? Peripheral artery disease [facility])        Surgical History:   Procedure Laterality Date   ? BYPASS GRAFT FEMORAL-POPLITEAL ARTERY Right 11/12/2019    Performed by Lonell Grandchild, MD at [facility] OR   ? INCISION AND DRAINAGE ABSCESS COMPLICATED/ MULTIPLE - LOWER EXTREMITY WITH PLACEMENT OF WOUND VAC Right 01/04/2020    Performed by Lonell Grandchild, MD at [facility] [place]   ? HX CORONARY ARTERY BYPASS GRAFT  ~2004    triple bypass       Allergies:  Allergies   Allergen Reactions   ? Penicillin G RASH and EDEMA       Medication List:  ? acetaminophen (TYLENOL) 325 mg tablet Take two tablets by mouth every 4 hours as needed for Pain. TOTAL ACETAMINOPHEN DOSE NOT TO EXCEED 4GM DAILY  Indications: pain   ? aspirin EC 81 mg tablet Take 81 mg by mouth daily. Take with food.   ? atorvastatin (LIPITOR) 40 mg tablet Take one tablet by mouth daily.   ? cephalexin (KEFLEX) 500 mg capsule Take one capsule by mouth three times daily for 14 days.   ? HYDROcodone/acetaminophen (NORCO) 5/325 mg tablet Take one tablet by mouth every 6 hours as needed for Pain   ? lisinopriL-hydrochlorothiazide (ZESTORETIC) 20-12.5 mg tablet Take 1 tablet by mouth daily.   ? oxyCODONE (ROXICODONE) 5 mg tablet Take one tablet to two tablets by mouth every 12 hours as needed Indications: pain   ? vitamins, multiple tablet Take 1 tablet by mouth daily.   ? XARELTO 20 mg tablet Take one tablet by mouth daily with breakfast.       Social History:   reports that Nicholas has been smoking cigarettes. Nicholas has a 17.50 pack-year smoking history. Nicholas has never used smokeless tobacco. Nicholas reports current alcohol use. Nicholas reports that Nicholas does not use drugs.    Family  History   Problem Relation Age of Onset   ? Heart Disease Father    ? Cancer Father    ? Cancer Maternal Grandfather        Review of Systems   Constitutional: Negative for activity change, appetite change, chills, diaphoresis, fatigue, fever and unexpected weight change.   HENT: Negative for hearing loss, nosebleeds and tinnitus.    Eyes: Negative for photophobia, itching and visual disturbance.   Respiratory: Negative for apnea, cough, chest tightness and shortness of breath.    Cardiovascular: Negative for chest pain, palpitations and leg swelling.   Gastrointestinal: Negative for abdominal pain, blood in stool, constipation, diarrhea, nausea and vomiting.   Endocrine: Negative for cold intolerance and heat intolerance.   Genitourinary: Negative for dysuria, frequency and hematuria.   Musculoskeletal: Negative for arthralgias, back pain, gait problem, joint swelling and myalgias.   Skin: Negative for color change, rash and wound.   Allergic/Immunologic: Negative for environmental allergies, food allergies and immunocompromised state.   Neurological: Negative for dizziness, tremors, seizures, syncope, facial asymmetry, speech difficulty, weakness, light-headedness and headaches.   Hematological: Negative for adenopathy. Does not bruise/bleed easily.   Psychiatric/Behavioral: Negative for behavioral problems, confusion and dysphoric mood. The patient is not nervous/anxious.    All other systems reviewed and are negative.              Vitals:    02/24/20 0819   BP: 111/74   Pulse: 110   Weight: (!) 181.4 kg (400 lb)   Height: 182.9 cm (72)     Body mass index is 54.25 kg/m?Marland Kitchen     Physical Exam  Vitals signs and nursing note reviewed.   Constitutional:       General: Nicholas is not in acute distress.     Appearance: Nicholas is well-developed. Nicholas is morbidly obese. Nicholas is not ill-appearing or diaphoretic.   HENT:      Head: Normocephalic.   Eyes:      General:         Right eye: No discharge.         Left eye: No discharge.   Neck:      Trachea: Phonation normal.   Cardiovascular:      Comments: I could not feel femoral pulses, likely due to body habitus.  Pulmonary:      Effort: Pulmonary effort is normal. No respiratory distress.   Abdominal:      General: Abdomen is protuberant.      Comments: Nicholas has a large pannus.   Musculoskeletal:      Right foot: Normal range of motion. No deformity.      Left foot: Normal range of motion. No deformity.   Feet:      Right foot:      Skin integrity: Dry skin present. No ulcer, blister, skin breakdown or erythema.      Left foot:      Skin integrity: Dry skin present. No ulcer, blister, skin breakdown or erythema.      Comments: His feet are warm and pink bilaterally.  There is no evidence of skin breakdown.  Nicholas does have dry skin around his heels bilaterally.  Skin:     General: Skin is warm and dry.      Capillary Refill: Capillary refill takes less than 2 seconds.      Coloration: Skin is not cyanotic or mottled.      Comments: Nicholas has a healing right lower leg wound that is now flush  with the surrounding tissue.  There is no surrounding redness, edema or drainage from the wound.  Photos were taken.  Bacitracin ointment and gauze were replaced over the wound.   Neurological:      General: No focal deficit present.      Mental Status: Nicholas is alert and oriented to person, place, and time.      Motor: Motor function is intact.      Coordination: Coordination is intact.      Gait: Gait is intact.   Psychiatric:         Attention and Perception: Attention normal.         Mood and Affect: Mood normal.         Speech: Speech normal.         Behavior: Behavior normal. Behavior is cooperative.         Thought Content: Thought content normal.             Assessment and Plan:    1. PAD (peripheral artery disease) (HCC)     2. H/O peripheral artery bypass     3. Open wound of leg, right, subsequent encounter     4. History of hyperglycemia     5. Morbid obesity (HCC)     6. Essential hypertension     7. Mixed hyperlipidemia       Nicholas has a slowly healing right lower leg wound without surrounding redness, edema or drainage, status post infected seroma debridement on January 5 of 2021.  Nicholas can stop the Aquacel Ag application now.  Nicholas is walking better, has less pain and swelling and is trying to reduce his smoking.  We discussed waiting to return to work until his leg wound is completely healed and Nicholas has had his bypass graft surveillance ultrasound and ABIs next month.  We discussed smoking cessation and increasing activity levels with a walking program.    Continue aspirin, Xarelto and Lipitor daily.    May convert wound care to bacitracin ointment and gauze.    Although not specifically discussed at this visit, Nicholas should probably have a hemoglobin A1c test.  Nicholas may benefit from medication such as metformin/Glucophage which may help with weight loss as well.  Will defer to his primary care physician for evaluation and treatment.    Return to clinic for bypass graft surveillance ultrasound and ABIs as already scheduled.    Nicholas is to contact us if his wound gets worse instead of getting better.    We gave him information on a walking program for his visit summary.    Kathaleen Maser, APRN-NP

## 2020-02-24 NOTE — Patient Instructions
A Walking Program for Peripheral Arterial Disease (PAD)?  Peripheral arterial disease (PAD)?is a condition where the arteries in the legs are severely damaged. When you have PAD, walking can be painful. So you may start to walk less. Walking less makes your leg muscles weaker. It then becomes harder and more painful to walk. A walking program can break this cycle. Here's how to get started.?    Walking farther without pain  Exercise strengthens leg muscles that are out of shape. It also trains these muscles to work with less oxygen. This helps your leg muscles work better even with reduced blood flow to your legs. When you have PAD, a walking program can be helpful. Your program can:  ? Help you walk longer and farther without claudication. This is an ache in your legs during exercise that goes away with rest.  ? Let you do more and be more active  ? Add to your overall health and well-being  ? Help you control your blood sugar and blood pressure  ? Help you become healthier with no risk and at little or no cost  Getting started  Your local hospital, vascular center, or cardiac rehab center may have a special walking program for people with PAD. If so, this is your best option. But if you can?t find a program, or it?s not covered by insurance, you can still walk on your own. Follow these steps at each session:  ? Step 1.?Start at a pace that lets you walk for 5 to 10 minutes before you start to feel claudication. This feeling is unpleasant, but it doesn?t hurt you. Keep going until the pain makes you feel that you need to stop.  ? Step 2.?Stop and rest for 3 to 5 minutes, just long enough for the pain to go away. You can rest standing or sitting. (Some people like to bring along a cane or a lightweight folding chair.)  ? Step 3.?Again walk at a pace that lets you walk for 5 to 10 minutes more before you feel pain. This may be slower than your starting pace in step 1. Then rest again.  ? Step 4. Repeat this process until you?ve walked for 45 minutes. This should be about 60 to 80 minutes total, including resting time. You may not be able to do a full 45 minutes at first. Do as much as you can, and increase your time as you improve.  Making the most of your program  ? Walk at least 3 times a week. Take no more than 2 days off between sessions. If you stop walking, even for a week or two, you can lose the health benefits of your program.  ? Find a good place to walk.?A treadmill or a track may be better at first. That way, you won?t run the risk of going too far and finding that you can?t walk back. Be sure to have a place to walk indoors in bad weather, such as a gym or a mall.  ? Wear shoes with sturdy, flexible soles.?The heel should fit without slipping. You should have room to wiggle your toes.  ? Keep track of how long you walk.?A pedometer will show your?daily progress. It can also show how much farther you can walk as time goes by.  ? Ask a friend to keep you company.?You may enjoy walking with someone else. Or you may want to make your walking sessions a time to relax by yourself.  ? Make it fun.?Listen to  music while you walk and rest. Walk in a favorite park. Get to know the people at the gym, or the folks that you pass on your route. While you rest, you can window-shop, read, or feed the birds. Do whatever will help you enjoy your exercise sessions.    [employer] last reviewed this educational content on 09/29/2018  ? 2000-2020 [employer], [place]. All rights reserved. This information is not intended as a substitute for professional medical care. Always follow your healthcare professional's instructions.

## 2020-02-28 ENCOUNTER — Encounter: Admit: 2020-02-28 | Discharge: 2020-02-28 | Payer: BC Managed Care – PPO

## 2020-02-28 NOTE — Telephone Encounter
Neysa Bonito, from [facility], is calling to get clarification on any new wound care orders and how often it needs to be changed. Please return her call [phone]

## 2020-02-28 NOTE — Telephone Encounter
Spoke with [place] and reviewed per last office notes He can stop the Aquacel Ag application now.  May convert wound care to bacitracin ointment and gauze and reviewed it would be a daily dressing change. She voiced understanding and no further concerns voiced at this time.

## 2020-03-06 ENCOUNTER — Encounter: Admit: 2020-03-06 | Discharge: 2020-03-06 | Payer: BC Managed Care – PPO

## 2020-04-04 ENCOUNTER — Encounter: Admit: 2020-04-04 | Discharge: 2020-04-04 | Payer: BC Managed Care – PPO

## 2020-04-04 ENCOUNTER — Ambulatory Visit: Admit: 2020-04-04 | Discharge: 2020-04-04 | Payer: BC Managed Care – PPO

## 2020-04-04 DIAGNOSIS — Z9889 Other specified postprocedural states: Secondary | ICD-10-CM

## 2020-04-04 DIAGNOSIS — I739 Peripheral vascular disease, unspecified: Secondary | ICD-10-CM

## 2020-04-04 DIAGNOSIS — Z8639 Personal history of other endocrine, nutritional and metabolic disease: Secondary | ICD-10-CM

## 2020-04-04 DIAGNOSIS — I10 Essential (primary) hypertension: Secondary | ICD-10-CM

## 2020-04-04 DIAGNOSIS — I2510 Atherosclerotic heart disease of native coronary artery without angina pectoris: Secondary | ICD-10-CM

## 2020-04-04 DIAGNOSIS — G4733 Obstructive sleep apnea (adult) (pediatric): Secondary | ICD-10-CM

## 2020-04-04 DIAGNOSIS — E782 Mixed hyperlipidemia: Secondary | ICD-10-CM

## 2020-04-04 DIAGNOSIS — I82409 Acute embolism and thrombosis of unspecified deep veins of unspecified lower extremity: Secondary | ICD-10-CM

## 2020-04-04 DIAGNOSIS — E785 Hyperlipidemia, unspecified: Secondary | ICD-10-CM

## 2020-04-04 LAB — HEMOGLOBIN A1C: Lab: 10 % — ABNORMAL HIGH (ref 4.0–6.0)

## 2020-04-04 MED ORDER — XARELTO 20 MG PO TAB
20 mg | ORAL_TABLET | Freq: Every day | ORAL | 3 refills | 30.00000 days | Status: AC
Start: 2020-04-04 — End: ?

## 2020-04-04 NOTE — Progress Notes
Date of Service: 04/04/2020              Chief Complaint   Patient presents with   ? PVD       History of Present Illness    He is a 49 year old male with hypertension, hyperlipidemia, and history of venous thrombosis who returns for follow-up right leg bypass graft ultrasound and ABIs.  His right lower leg wound has healed.    His bypass graft today is patent without any areas of elevated velocity indicating stenosis.  His ABIs today are 0.79 on the right with a TBI of 0.49 and an ABI on the left of 0.74 with a TBI of 0.55.    He is postop right femoral to popliteal artery bypass 11/29/2019 for leg pain and occluded right popliteal artery aneurysm. This was complicated by an infected seroma in his right lower leg  requiring surgical debridement on 01/04/2020 .    He denies any fevers or chills.  He is complaining of some right shoulder and chest discomfort that started yesterday.  He has no recollection of injury.  This is not the same discomfort that he had prior to his coronary artery bypass grafting for symptoms of chest pressure angina.  He denies TIA and strokelike symptoms and denies symptoms of claudication when he walks.He continues to have mild discomfort and numbness in his right medial thigh and lower leg.  No foot pain at this time.      He has a history, as stated, of coronary artery bypass grafting.  He has never had a stroke.    He continues to smoke 1/2 pack/day of cigarettes.  He continues on aspirin, Xarelto and Lipitor daily.    He has no documented hemoglobin A1c and no documented history of diabetes although all of his blood sugars on his basic metabolic panels are high, and he is not on medication for hyperglycemia..         Medical History:   Diagnosis Date   ? Coronary artery disease    ? Deep vein thrombosis (HCC)     bilateral lower extrmities   ? Hyperlipidemia    ? Hypertension    ? OSA on CPAP    ? Peripheral artery disease [facility])        Surgical History:   Procedure Laterality Date   ? BYPASS GRAFT FEMORAL-POPLITEAL ARTERY Right 11/12/2019    Performed by Lonell Grandchild, MD at [facility] OR   ? INCISION AND DRAINAGE ABSCESS COMPLICATED/ MULTIPLE - LOWER EXTREMITY WITH PLACEMENT OF WOUND VAC Right 01/04/2020    Performed by Lonell Grandchild, MD at [facility] OR   ? HX CORONARY ARTERY BYPASS GRAFT  ~2004    triple bypass       Allergies:  Allergies   Allergen Reactions   ? Penicillin G RASH and EDEMA       Medication List:  ? acetaminophen (TYLENOL) 325 mg tablet Take two tablets by mouth every 4 hours as needed for Pain. TOTAL ACETAMINOPHEN DOSE NOT TO EXCEED 4GM DAILY  Indications: pain   ? aspirin EC 81 mg tablet Take 81 mg by mouth daily. Take with food.   ? atorvastatin (LIPITOR) 40 mg tablet Take one tablet by mouth daily.   ? HYDROcodone/acetaminophen (NORCO) 5/325 mg tablet Take one tablet by mouth every 6 hours as needed for Pain   ? lisinopriL-hydrochlorothiazide (ZESTORETIC) 20-12.5 mg tablet Take 1 tablet by mouth daily.   ? oxyCODONE (ROXICODONE) 5 mg tablet Take  one tablet to two tablets by mouth every 12 hours as needed Indications: pain   ? vitamins, multiple tablet Take 1 tablet by mouth daily.   ? XARELTO 20 mg tablet Take one tablet by mouth daily with breakfast.       Social History:   reports that he has been smoking cigarettes. He has a 17.50 pack-year smoking history. He has never used smokeless tobacco. He reports current alcohol use. He reports that he does not use drugs.    Family History   Problem Relation Age of Onset   ? Heart Disease Father    ? Cancer Father    ? Cancer Maternal Grandfather        Review of Systems   Constitutional: Positive for fatigue. Negative for activity change, appetite change, chills, diaphoresis, fever and unexpected weight change.   HENT: Negative for hearing loss, nosebleeds and tinnitus.    Eyes: Negative for photophobia, itching and visual disturbance.   Respiratory: Negative for apnea, cough, chest tightness and shortness of breath.    Cardiovascular: Positive for leg swelling. Negative for chest pain and palpitations.   Gastrointestinal: Negative for abdominal pain, blood in stool, constipation, diarrhea, nausea and vomiting.   Endocrine: Negative for cold intolerance and heat intolerance.   Genitourinary: Negative for dysuria, frequency and hematuria.   Musculoskeletal: Positive for back pain and neck stiffness. Negative for arthralgias, gait problem, joint swelling and myalgias.   Skin: Negative for color change, rash and wound.   Allergic/Immunologic: Negative for environmental allergies, food allergies and immunocompromised state.   Neurological: Negative for dizziness, tremors, seizures, syncope, facial asymmetry, speech difficulty, weakness, light-headedness and headaches.   Hematological: Negative for adenopathy. Does not bruise/bleed easily.   Psychiatric/Behavioral: Negative for behavioral problems, confusion and dysphoric mood. The patient is not nervous/anxious.                Vitals:    04/04/20 1035   BP: 105/63   BP Source: Arm, Left Upper   Patient Position: Sitting   Pulse: 86   Height: 182.9 cm (72)     Body mass index is 54.25 kg/m?Marland Kitchen     Physical Exam  Vitals signs and nursing note reviewed.   Constitutional:       General: He is not in acute distress.     Appearance: He is well-developed. He is morbidly obese. He is not diaphoretic.   HENT:      Head: Normocephalic.   Eyes:      General:         Right eye: No discharge.         Left eye: No discharge.   Neck:      Musculoskeletal: Neck supple.      Vascular: No carotid bruit.      Trachea: Phonation normal. No tracheal tenderness.   Cardiovascular:      Rate and Rhythm: Normal rate and regular rhythm.      Pulses:           Carotid pulses are 2+ on the right side and 2+ on the left side.       Radial pulses are 2+ on the right side and 2+ on the left side.        Dorsalis pedis pulses are detected w/ Doppler on the right side and detected w/ Doppler on the left side.        Posterior tibial pulses are detected w/ Doppler on the right side and detected w/ Doppler  on the left side.      Heart sounds: Normal heart sounds.   Pulmonary:      Effort: Pulmonary effort is normal. No respiratory distress.      Breath sounds: Decreased breath sounds and wheezing present.   Abdominal:      General: Abdomen is protuberant.   Musculoskeletal: Normal range of motion.      Right lower leg: 2+ Edema present.      Left lower leg: 2+ Edema present.      Right foot: Normal range of motion. No deformity.      Left foot: Normal range of motion. No deformity.   Feet:      Right foot:      Skin integrity: Dry skin present. No ulcer, blister, skin breakdown or erythema.      Left foot:      Skin integrity: Dry skin present. No ulcer, blister, skin breakdown or erythema.      Comments: He has dry skin around the lateral edges of his bilateral heels.  Skin:     General: Skin is warm and dry.      Capillary Refill: Capillary refill takes less than 2 seconds.      Coloration: Skin is not cyanotic or mottled.      Comments: He has right medial leg scars that are well-healed.  He has a left lower leg medial vein harvest scar.   Neurological:      General: No focal deficit present.      Mental Status: He is alert and oriented to person, place, and time.      Motor: Motor function is intact.      Coordination: Coordination is intact.      Gait: Gait is intact.      Comments: His grip strength is strong and equal bilaterally.   Psychiatric:         Attention and Perception: Attention normal.         Mood and Affect: Mood normal.         Speech: Speech normal.         Behavior: Behavior normal. Behavior is cooperative.         Thought Content: Thought content normal.         Cognition and Memory: Cognition normal.         Right Brachial Systolic: 121   Left Brachial Systolic: 113   Right ABI: 0.79  Left ABI: 0.74      Assessment and Plan:    1. PAD (peripheral artery disease) (HCC)  US DOPPLER ART LE RIGHT    [place] NONINVASIVE LOWER EXT PVR   2. H/O peripheral artery bypass  US DOPPLER ART LE RIGHT    [place] NONINVASIVE LOWER EXT PVR   3. History of hyperglycemia  HEMOGLOBIN A1C   4. Essential hypertension     5. Mixed hyperlipidemia       He has stable ABIs with no complaints of claudication or rest pain.  He has no ischemic changes to his feet.  His bypass graft is patent without any areas of significant stenosis.  I discussed with him his results and recommendations for follow-up.  We discussed atherosclerotic risk factor modification.  I discussed with him my recommendations to obtain a hemoglobin A1c.  He will do that on his way out of office today.  I also refilled his Xarelto.    He should continue his aspirin, Xarelto and Lipitor daily.    We can follow-up  by phone and with his primary care physician with results of his hemoglobin A1c.    I gave him information on limb threatening ischemia and a walking program for his visit summary.    He may return to work without restrictions on Sunday, April 09, 2020.    Follow-up in 3 months for a right leg bypass graft surveillance ultrasound, ABIs and an office visit if available at that time.    Kathaleen Maser, APRN-NP

## 2020-07-14 ENCOUNTER — Encounter: Admit: 2020-07-14 | Discharge: 2020-07-14 | Payer: BC Managed Care – PPO

## 2020-12-24 ENCOUNTER — Encounter: Admit: 2020-12-24 | Discharge: 2020-12-24 | Payer: BC Managed Care – PPO

## 2020-12-24 MED ORDER — ATORVASTATIN 40 MG PO TAB
ORAL_TABLET | Freq: Every day | 0 refills
Start: 2020-12-24 — End: ?

## 2021-04-03 ENCOUNTER — Encounter: Admit: 2021-04-03 | Discharge: 2021-04-03 | Payer: BC Managed Care – PPO

## 2021-04-03 MED ORDER — ATORVASTATIN 40 MG PO TAB
ORAL_TABLET | Freq: Every day | 0 refills | Status: AC
Start: 2021-04-03 — End: ?

## 2021-05-18 ENCOUNTER — Encounter: Admit: 2021-05-18 | Discharge: 2021-05-18 | Payer: BC Managed Care – PPO

## 2021-05-18 MED ORDER — XARELTO 20 MG PO TAB
ORAL_TABLET | Freq: Every day | 0 refills
Start: 2021-05-18 — End: ?

## 2024-02-06 ENCOUNTER — Encounter: Admit: 2024-02-06 | Discharge: 2024-02-06 | Payer: BC Managed Care – PPO

## 2024-08-22 IMAGING — CT CT angio chest PE protocol
2 of 7 series · 18 of 36 positions shown · IV contrast (CONT)
Comparison: CXR 09/06/2022

AGCHESTPE
REASON FOR EXAM: Shortness of breath. History of DVT. Concern for pulmonary embolism.
TECHNIQUE: Contrast-enhanced CT images of the chest were obtained using bolus tracking in a
pulmonary arterial phase of enhancement according to standard departmental CT pulmonary angiography
protocol. Postprocessed coronal and sagittal reformats were reviewed. 3D volume rendered
reformations of were obtained on a separate workstation.

[Series 5: pe chest lg · axial · 0.83mm/px · z∈[-225,+105]mm · 12 of 156 slices shown]
[im 12/156  lung]
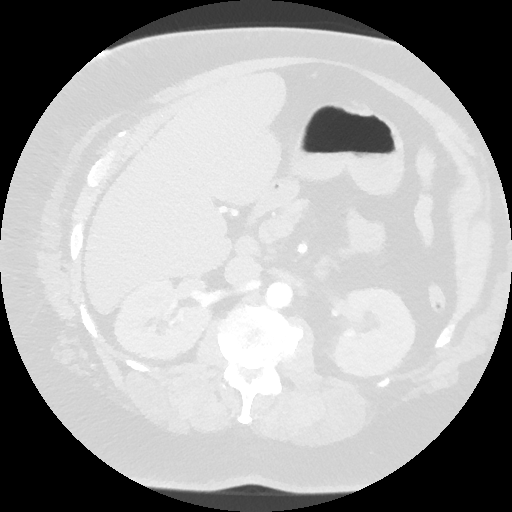
[im 24/156  mediastinal]
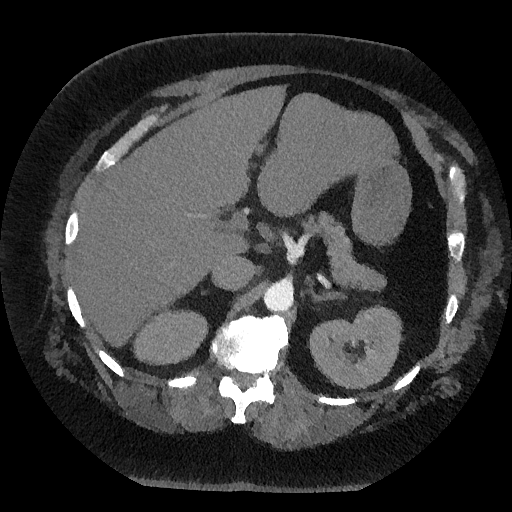
[im 36/156  lung]
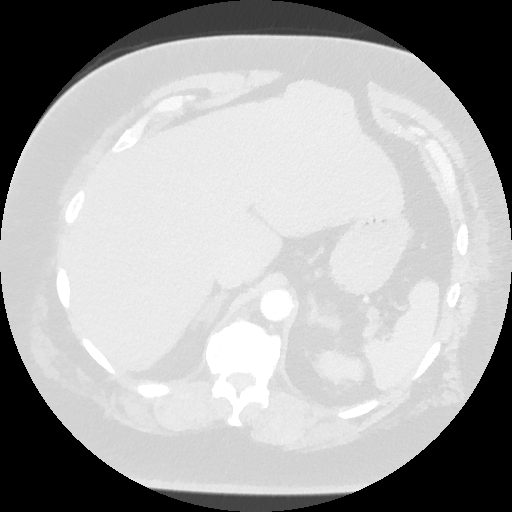
[im 48/156  mediastinal]
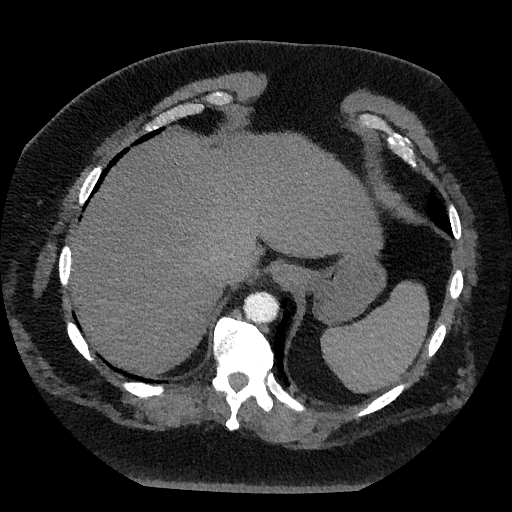
[im 60/156  lung]
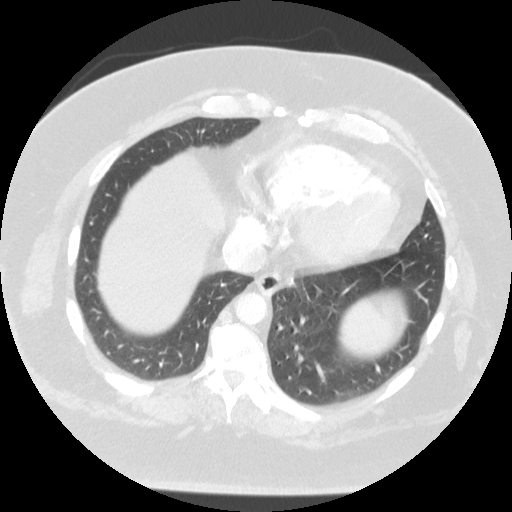
[im 72/156  mediastinal]
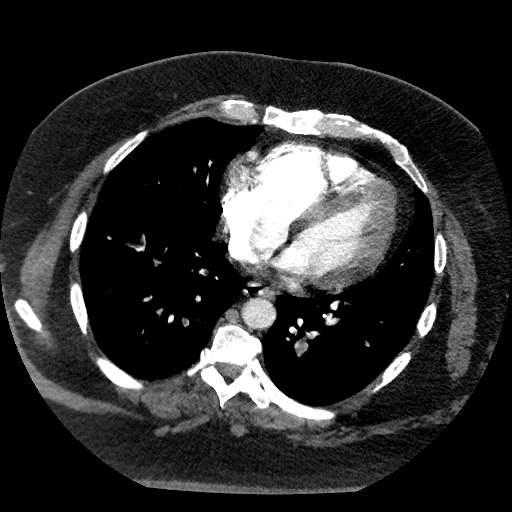
[im 84/156  lung]
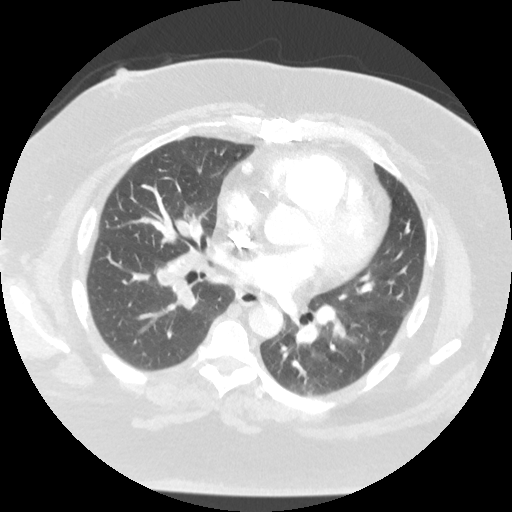
[im 96/156  mediastinal]
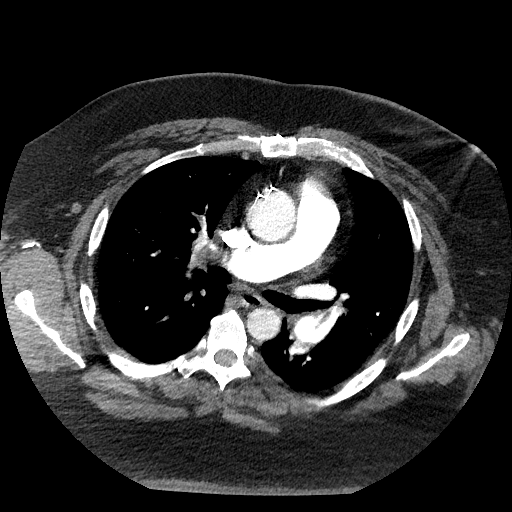
[im 108/156  lung]
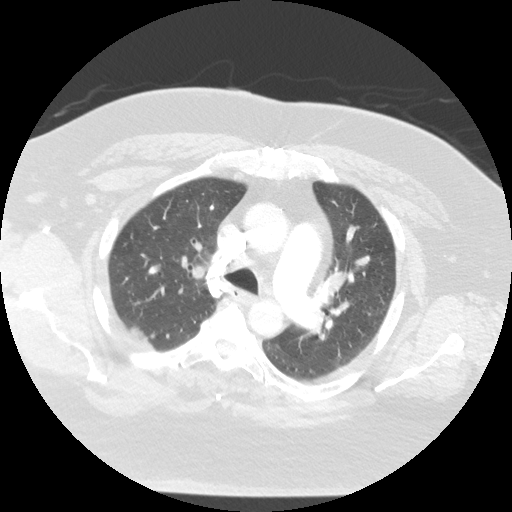
[im 120/156  mediastinal]
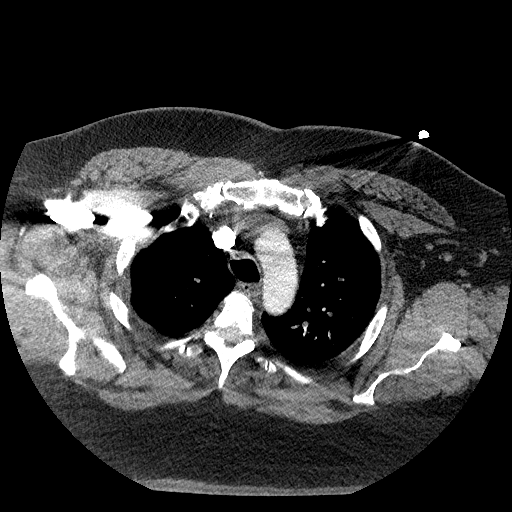
[im 132/156  lung]
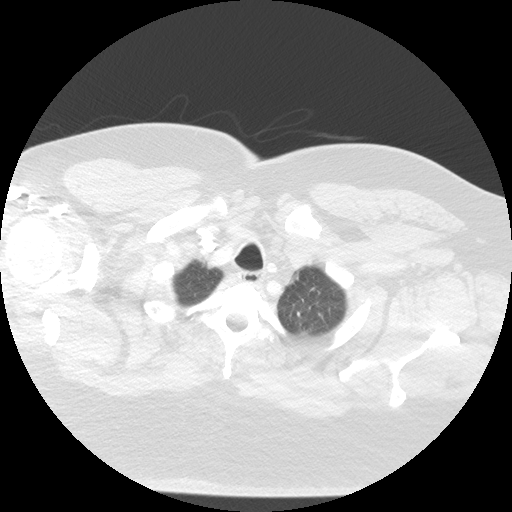
[im 144/156  mediastinal]
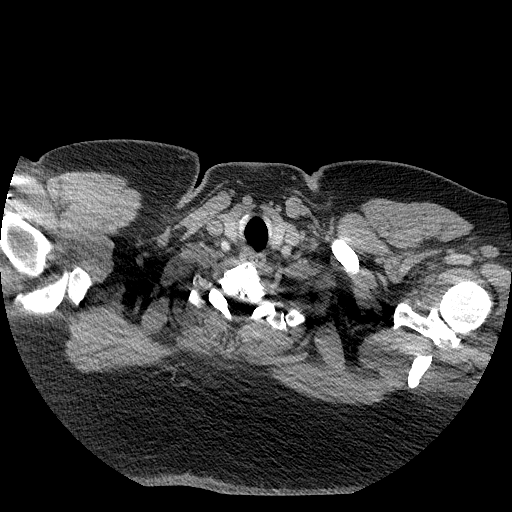

[Series 7: lung w/ · axial · 0.83mm/px · z∈[-225,+15]mm · 6 of 156 slices shown]
[im 12/156  mediastinal]
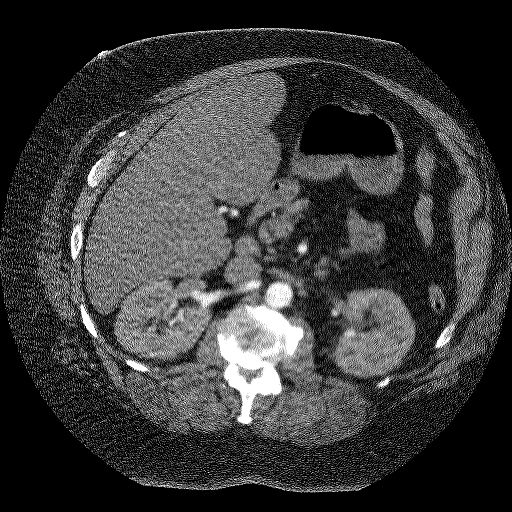
[im 36/156  mediastinal]
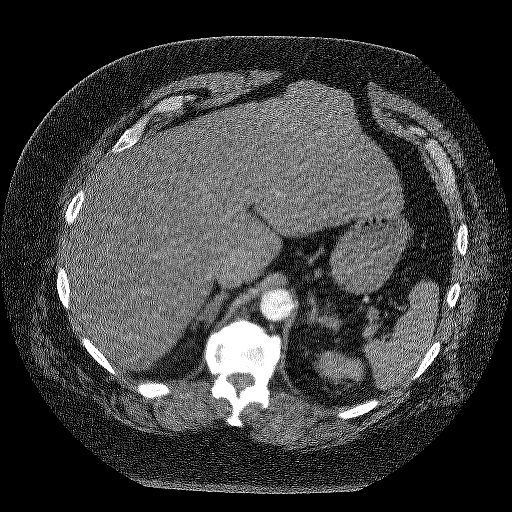
[im 48/156  mediastinal]
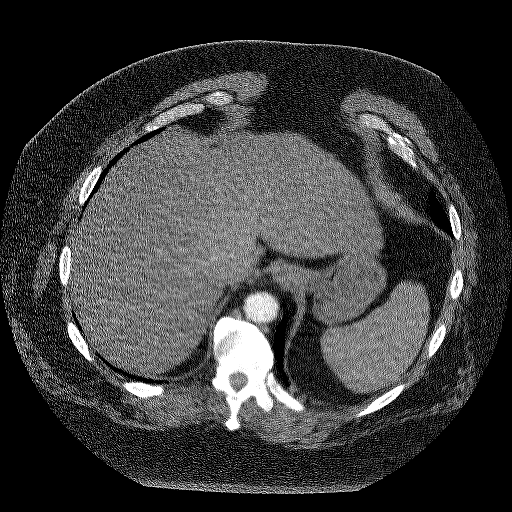
[im 72/156  mediastinal]
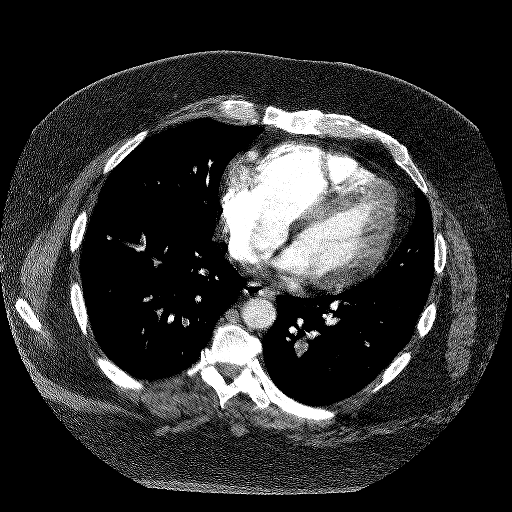
[im 84/156  mediastinal]
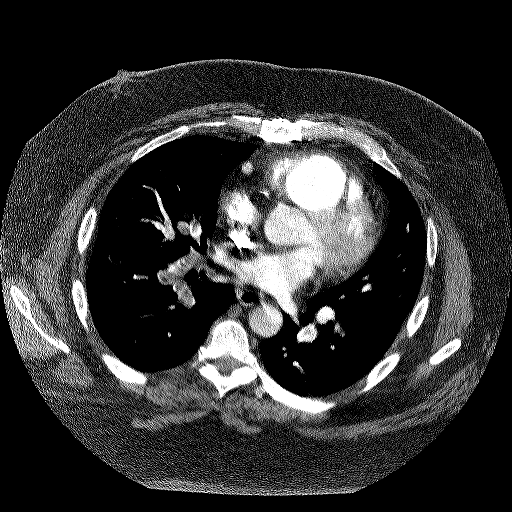
[im 108/156  mediastinal]
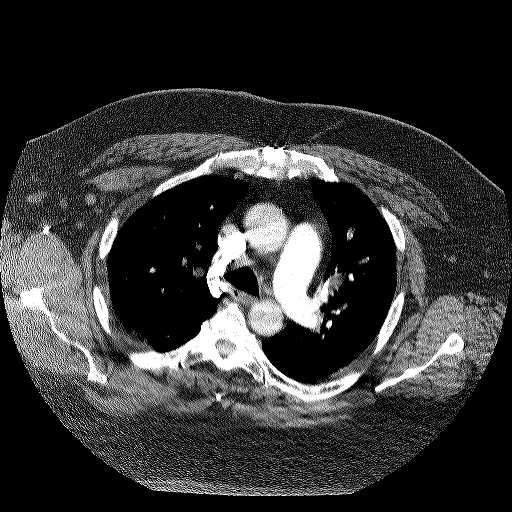

[18 of 36 positions shown; findings below may reference images not displayed]

FINDINGS: Contrast bolus timing of this examination is adequate. Contrast opacification of the pulmonary
arteries is adequate to the subsegmental branches. There is a large burden of pulmonary emboli
situated at the distal main branches and extending into all 5 lobes. The pulmonary trunk is dilated
measuring 3.9 cm. The RV/LV ratio is greater than 1. The heart is not significantly enlarged. The
thoracic aorta is of normal caliber. There is no pericardial effusion.
There is no pleural effusion or pneumothorax.
There are small slightly wedge-shaped airspace opacities in the upper lobe and superior segment of
the right lower lobe (reference image 58 series 5). A few additional scattered groundglass nodules
are seen in the lungs. There is no lobar consolidation.
There is no central airway obstruction.
The osseous structures are age appropriate without focal lytic or blastic lesions.
The liver appears enlarged although is only partially seen by this exam. Both adrenal glands are
unremarkable.
IMPRESSION: 1. Large burden of pulmonary emboli at the distal main right and left pulmonary arteries with emboli
extending into all 5 lobes.
2. CT evidence for right heart strain including enlarged pulmonary trunk and right cardiac chamber
enlargement.
3. Small, peripheral wedge-shaped airspace opacities probably represent small pulmonary infarcts.
There are additional scattered groundglass nodules which may be associated with pneumonitis or
atypical pneumonia.
4. Hepatomegaly.
Findings were discussed over the telephone with Linnea Leffler on 09/06/2022 at 4868 hours.
